# Patient Record
Sex: Male | Born: 1965 | Race: White | Hispanic: No | Marital: Married | Smoking: Current every day smoker
Health system: Southern US, Community
[De-identification: ages and names within clinical notes are randomized; demographics above are authoritative.]

## PROBLEM LIST (undated history)

## (undated) DIAGNOSIS — C3490 Malignant neoplasm of unspecified part of unspecified bronchus or lung: Secondary | ICD-10-CM

## (undated) DIAGNOSIS — K219 Gastro-esophageal reflux disease without esophagitis: Secondary | ICD-10-CM

## (undated) DIAGNOSIS — F329 Major depressive disorder, single episode, unspecified: Secondary | ICD-10-CM

## (undated) DIAGNOSIS — F1011 Alcohol abuse, in remission: Secondary | ICD-10-CM

## (undated) DIAGNOSIS — J9610 Chronic respiratory failure, unspecified whether with hypoxia or hypercapnia: Secondary | ICD-10-CM

## (undated) DIAGNOSIS — J449 Chronic obstructive pulmonary disease, unspecified: Secondary | ICD-10-CM

## (undated) DIAGNOSIS — E119 Type 2 diabetes mellitus without complications: Secondary | ICD-10-CM

## (undated) DIAGNOSIS — I469 Cardiac arrest, cause unspecified: Secondary | ICD-10-CM

## (undated) DIAGNOSIS — F1490 Cocaine use, unspecified, uncomplicated: Secondary | ICD-10-CM

## (undated) DIAGNOSIS — I2510 Atherosclerotic heart disease of native coronary artery without angina pectoris: Secondary | ICD-10-CM

## (undated) DIAGNOSIS — F419 Anxiety disorder, unspecified: Secondary | ICD-10-CM

## (undated) DIAGNOSIS — I779 Disorder of arteries and arterioles, unspecified: Secondary | ICD-10-CM

## (undated) DIAGNOSIS — M797 Fibromyalgia: Secondary | ICD-10-CM

## (undated) DIAGNOSIS — E785 Hyperlipidemia, unspecified: Secondary | ICD-10-CM

## (undated) DIAGNOSIS — R5600 Simple febrile convulsions: Secondary | ICD-10-CM

## (undated) DIAGNOSIS — Z720 Tobacco use: Secondary | ICD-10-CM

## (undated) DIAGNOSIS — F32A Depression, unspecified: Secondary | ICD-10-CM

## (undated) DIAGNOSIS — I10 Essential (primary) hypertension: Secondary | ICD-10-CM

## (undated) HISTORY — PX: RESECTION DISTAL CLAVICAL: SHX5053

## (undated) HISTORY — DX: Alcohol abuse, in remission: F10.11

## (undated) HISTORY — DX: Cardiac arrest, cause unspecified: I46.9

## (undated) HISTORY — DX: Malignant neoplasm of unspecified part of unspecified bronchus or lung: C34.90

## (undated) HISTORY — DX: Major depressive disorder, single episode, unspecified: F32.9

## (undated) HISTORY — DX: Hyperlipidemia, unspecified: E78.5

## (undated) HISTORY — DX: Atherosclerotic heart disease of native coronary artery without angina pectoris: I25.10

## (undated) HISTORY — DX: Disorder of arteries and arterioles, unspecified: I77.9

## (undated) HISTORY — DX: Cocaine use, unspecified, uncomplicated: F14.90

## (undated) HISTORY — PX: CARDIAC CATHETERIZATION: SHX172

## (undated) HISTORY — DX: Anxiety disorder, unspecified: F41.9

## (undated) HISTORY — DX: Tobacco use: Z72.0

## (undated) HISTORY — DX: Depression, unspecified: F32.A

## (undated) HISTORY — DX: Gastro-esophageal reflux disease without esophagitis: K21.9

## (undated) HISTORY — DX: Chronic respiratory failure, unspecified whether with hypoxia or hypercapnia: J96.10

## (undated) HISTORY — PX: HERNIA REPAIR: SHX51

## (undated) HISTORY — DX: Fibromyalgia: M79.7

## (undated) HISTORY — DX: Essential (primary) hypertension: I10

## (undated) HISTORY — DX: Simple febrile convulsions: R56.00

---

## 1990-10-10 HISTORY — PX: LEG SURGERY: SHX1003

## 1990-10-10 HISTORY — PX: HIP SURGERY: SHX245

## 1999-06-19 ENCOUNTER — Emergency Department (HOSPITAL_COMMUNITY): Admission: EM | Admit: 1999-06-19 | Discharge: 1999-06-20 | Payer: Self-pay | Admitting: Internal Medicine

## 2000-12-30 ENCOUNTER — Emergency Department (HOSPITAL_COMMUNITY): Admission: EM | Admit: 2000-12-30 | Discharge: 2000-12-30 | Payer: Self-pay | Admitting: Emergency Medicine

## 2000-12-30 ENCOUNTER — Encounter: Payer: Self-pay | Admitting: Emergency Medicine

## 2001-05-27 ENCOUNTER — Emergency Department (HOSPITAL_COMMUNITY): Admission: EM | Admit: 2001-05-27 | Discharge: 2001-05-27 | Payer: Self-pay | Admitting: Emergency Medicine

## 2001-12-02 ENCOUNTER — Emergency Department (HOSPITAL_COMMUNITY): Admission: EM | Admit: 2001-12-02 | Discharge: 2001-12-02 | Payer: Self-pay | Admitting: Emergency Medicine

## 2002-03-23 ENCOUNTER — Encounter: Payer: Self-pay | Admitting: Emergency Medicine

## 2002-03-23 ENCOUNTER — Emergency Department (HOSPITAL_COMMUNITY): Admission: EM | Admit: 2002-03-23 | Discharge: 2002-03-23 | Payer: Self-pay | Admitting: Emergency Medicine

## 2002-05-16 ENCOUNTER — Encounter: Payer: Self-pay | Admitting: Emergency Medicine

## 2002-05-16 ENCOUNTER — Emergency Department (HOSPITAL_COMMUNITY): Admission: EM | Admit: 2002-05-16 | Discharge: 2002-05-16 | Payer: Self-pay | Admitting: Emergency Medicine

## 2004-01-07 ENCOUNTER — Emergency Department (HOSPITAL_COMMUNITY): Admission: EM | Admit: 2004-01-07 | Discharge: 2004-01-07 | Payer: Self-pay | Admitting: Emergency Medicine

## 2004-05-12 ENCOUNTER — Encounter: Admission: RE | Admit: 2004-05-12 | Discharge: 2004-05-12 | Payer: Self-pay | Admitting: Internal Medicine

## 2005-10-13 ENCOUNTER — Ambulatory Visit (HOSPITAL_COMMUNITY): Admission: RE | Admit: 2005-10-13 | Discharge: 2005-10-13 | Payer: Self-pay | Admitting: Cardiology

## 2005-10-13 ENCOUNTER — Ambulatory Visit: Payer: Self-pay | Admitting: Cardiology

## 2006-05-19 ENCOUNTER — Emergency Department (HOSPITAL_COMMUNITY): Admission: EM | Admit: 2006-05-19 | Discharge: 2006-05-19 | Payer: Self-pay | Admitting: Emergency Medicine

## 2009-02-21 ENCOUNTER — Emergency Department (HOSPITAL_COMMUNITY): Admission: EM | Admit: 2009-02-21 | Discharge: 2009-02-21 | Payer: Self-pay | Admitting: *Deleted

## 2010-03-20 ENCOUNTER — Emergency Department (HOSPITAL_COMMUNITY): Admission: EM | Admit: 2010-03-20 | Discharge: 2010-03-21 | Payer: Self-pay | Admitting: Emergency Medicine

## 2010-04-24 ENCOUNTER — Emergency Department (HOSPITAL_COMMUNITY): Admission: EM | Admit: 2010-04-24 | Discharge: 2010-04-24 | Payer: Self-pay | Admitting: Emergency Medicine
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
## 2010-12-27 LAB — CBC
HCT: 38.3 % — ABNORMAL LOW (ref 39.0–52.0)
Hemoglobin: 12.6 g/dL — ABNORMAL LOW (ref 13.0–17.0)
MCHC: 33 g/dL (ref 30.0–36.0)
MCV: 92.3 fL (ref 78.0–100.0)
Platelets: 297 K/uL (ref 150–400)
RBC: 4.15 MIL/uL — ABNORMAL LOW (ref 4.22–5.81)
RDW: 15 % (ref 11.5–15.5)
WBC: 8.6 K/uL (ref 4.0–10.5)

## 2010-12-27 LAB — DIFFERENTIAL
Basophils Absolute: 0 10*3/uL (ref 0.0–0.1)
Basophils Relative: 0 % (ref 0–1)
Eosinophils Absolute: 0.2 10*3/uL (ref 0.0–0.7)
Eosinophils Relative: 3 % (ref 0–5)
Lymphocytes Relative: 38 % (ref 12–46)
Lymphs Abs: 3.2 K/uL (ref 0.7–4.0)
Monocytes Absolute: 0.7 10*3/uL (ref 0.1–1.0)
Monocytes Relative: 8 % (ref 3–12)
Neutro Abs: 4.4 10*3/uL (ref 1.7–7.7)
Neutrophils Relative %: 52 % (ref 43–77)

## 2010-12-27 LAB — BASIC METABOLIC PANEL
CO2: 29 mEq/L (ref 19–32)
Chloride: 105 mEq/L (ref 96–112)
Creatinine, Ser: 0.75 mg/dL (ref 0.4–1.5)
GFR calc Af Amer: >60 mL/min (ref >60)
Potassium: 3.2 mEq/L — ABNORMAL LOW (ref 3.5–5.1)

## 2010-12-27 LAB — ETHANOL: Alcohol, Ethyl (B): 288 mg/dL — ABNORMAL HIGH (ref 0–10)

## 2010-12-27 LAB — RAPID URINE DRUG SCREEN, HOSP PERFORMED
Amphetamines: NOT DETECTED
Barbiturates: NOT DETECTED
Benzodiazepines: POSITIVE — AB
Cocaine: NOT DETECTED
Opiates: NOT DETECTED
Tetrahydrocannabinol: POSITIVE — AB

## 2010-12-27 LAB — BASIC METABOLIC PANEL WITH GFR
BUN: 2 mg/dL — ABNORMAL LOW (ref 6–23)
Calcium: 8.4 mg/dL (ref 8.4–10.5)
GFR calc non Af Amer: >60 mL/min (ref >60)
Glucose, Bld: 140 mg/dL — ABNORMAL HIGH (ref 70–99)
Sodium: 142 meq/L (ref 135–145)

## 2011-01-18 LAB — ETHANOL: Alcohol, Ethyl (B): 123 mg/dL — ABNORMAL HIGH (ref 0–10)

## 2011-02-25 NOTE — Cardiovascular Report (Signed)
NAME:  Wesley Howell, Wesley Howell NO.:  [id number]   MEDICAL RECORD NO.:  [id number]          PATIENT TYPE:  OIB   LOCATION:  2899                         FACILITY:  [facility]   PHYSICIAN:  Salvadore Farber, M.D. LHCDATE OF BIRTH:  [birth year]   DATE OF PROCEDURE:  10/13/2005  DATE OF DISCHARGE:                              CARDIAC CATHETERIZATION   PROCEDURE:  Left heart catheterization, left ventriculography, coronary  angiography.   INDICATIONS:  [name] is a 45 year old gentleman with longstanding  tobacco abuse and dyslipidemia who presents with atypical chest discomfort  occurring both at rest and at exertion and lasting 1-2 minutes. This has  been present for a long period of time, but has been somewhat worse of late.  It prompted admission to the hospital and transfer for cardiac  catheterization.   PROCEDURAL TECHNIQUE:  Informed consent was obtained. Under 1% lidocaine  local anesthesia, a 5-French sheath was placed in the right common femoral  artery using the modified Seldinger technique. Diagnostic angiography and  ventriculography were performed using JL-4, JR-4, and pigtail catheters. The  patient tolerated the procedure well and was transferred to holding room in  stable condition.   COMPLICATIONS:  None.   FINDINGS:  1.  LV:  135/10/17. EF 65% without regional wall motion abnormality.  2.  No aortic stenosis or mitral regurgitation.  3.  Left main:  Angiographically normal.  4.  LAD:  Moderate size vessel giving rise to a single large diagonal.  It      is angiographically normal.  5.  Circumflex:  Moderate-sized vessel giving rise to 3 obtuse marginals.      The first marginal has a 20% ostial stenosis. The remainder is free of      significant disease.  6.  RCA:  Moderate-sized dominant vessel.  There is a 20% stenosis of the      mid vessel.   IMPRESSION/PLAN:  The patient has minimal coronary disease. There is no  cardiac explanation for his  chest discomfort. Will discharge him to home for  further evaluation by Dr. Sylvie Farrier.      Salvadore Farber, M.D. [facility]  Electronically Signed     WED/MEDQ  D:  10/13/2005  T:  10/13/2005  Job:  [id number]   cc:   Heide Guile, MD  Fax: [phone]
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
## 2011-03-07 ENCOUNTER — Emergency Department (HOSPITAL_COMMUNITY)
Admission: EM | Admit: 2011-03-07 | Discharge: 2011-03-07 | Disposition: A | Discharge status: Home / Self Care | Payer: Medicaid Other | Attending: Emergency Medicine | Admitting: Emergency Medicine

## 2011-03-07 DIAGNOSIS — E789 Disorder of lipoprotein metabolism, unspecified: Secondary | ICD-10-CM | POA: Insufficient documentation

## 2011-03-07 DIAGNOSIS — I2510 Atherosclerotic heart disease of native coronary artery without angina pectoris: Secondary | ICD-10-CM | POA: Insufficient documentation

## 2011-03-07 DIAGNOSIS — I10 Essential (primary) hypertension: Secondary | ICD-10-CM | POA: Insufficient documentation

## 2011-03-07 DIAGNOSIS — M7989 Other specified soft tissue disorders: Secondary | ICD-10-CM

## 2011-03-07 DIAGNOSIS — E119 Type 2 diabetes mellitus without complications: Secondary | ICD-10-CM | POA: Insufficient documentation

## 2011-03-07 DIAGNOSIS — Z79899 Other long term (current) drug therapy: Secondary | ICD-10-CM | POA: Insufficient documentation

## 2011-03-07 DIAGNOSIS — J438 Other emphysema: Secondary | ICD-10-CM | POA: Insufficient documentation

## 2011-03-07 DIAGNOSIS — N23 Unspecified renal colic: Secondary | ICD-10-CM | POA: Insufficient documentation

## 2011-03-07 DIAGNOSIS — R609 Edema, unspecified: Secondary | ICD-10-CM | POA: Insufficient documentation

## 2011-03-07 DIAGNOSIS — M79609 Pain in unspecified limb: Secondary | ICD-10-CM | POA: Insufficient documentation

## 2011-03-07 DIAGNOSIS — F329 Major depressive disorder, single episode, unspecified: Secondary | ICD-10-CM | POA: Insufficient documentation

## 2011-03-07 DIAGNOSIS — F3289 Other specified depressive episodes: Secondary | ICD-10-CM | POA: Insufficient documentation

## 2011-03-07 LAB — BASIC METABOLIC PANEL: GFR calc Af Amer: >60 mL/min (ref >60)

## 2011-03-07 LAB — D-DIMER, QUANTITATIVE: D-Dimer, Quant: 1.03 ug/mL-FEU — ABNORMAL HIGH (ref 0.00–0.48)

## 2012-10-17 ENCOUNTER — Encounter (HOSPITAL_COMMUNITY): Payer: Self-pay | Admitting: Emergency Medicine

## 2012-10-17 ENCOUNTER — Emergency Department (HOSPITAL_COMMUNITY): Payer: No Typology Code available for payment source

## 2012-10-17 ENCOUNTER — Emergency Department (HOSPITAL_COMMUNITY)
Admission: EM | Admit: 2012-10-17 | Discharge: 2012-10-17 | Disposition: A | Discharge status: Home / Self Care | Payer: No Typology Code available for payment source | Attending: Emergency Medicine | Admitting: Emergency Medicine

## 2012-10-17 DIAGNOSIS — J4489 Other specified chronic obstructive pulmonary disease: Secondary | ICD-10-CM | POA: Insufficient documentation

## 2012-10-17 DIAGNOSIS — IMO0002 Reserved for concepts with insufficient information to code with codable children: Secondary | ICD-10-CM | POA: Insufficient documentation

## 2012-10-17 DIAGNOSIS — Y9389 Activity, other specified: Secondary | ICD-10-CM | POA: Insufficient documentation

## 2012-10-17 DIAGNOSIS — Z79899 Other long term (current) drug therapy: Secondary | ICD-10-CM | POA: Insufficient documentation

## 2012-10-17 DIAGNOSIS — J449 Chronic obstructive pulmonary disease, unspecified: Secondary | ICD-10-CM | POA: Insufficient documentation

## 2012-10-17 DIAGNOSIS — S20219A Contusion of unspecified front wall of thorax, initial encounter: Secondary | ICD-10-CM | POA: Insufficient documentation

## 2012-10-17 DIAGNOSIS — S46912A Strain of unspecified muscle, fascia and tendon at shoulder and upper arm level, left arm, initial encounter: Secondary | ICD-10-CM

## 2012-10-17 DIAGNOSIS — F17200 Nicotine dependence, unspecified, uncomplicated: Secondary | ICD-10-CM | POA: Insufficient documentation

## 2012-10-17 DIAGNOSIS — E119 Type 2 diabetes mellitus without complications: Secondary | ICD-10-CM | POA: Insufficient documentation

## 2012-10-17 HISTORY — DX: Chronic obstructive pulmonary disease, unspecified: J44.9

## 2012-10-17 HISTORY — DX: Type 2 diabetes mellitus without complications: E11.9

## 2012-10-17 MED ORDER — ALPRAZOLAM 0.25 MG PO TABS
1.0000 mg | ORAL_TABLET | Freq: Once | ORAL | Status: AC
  Administered 2012-10-17: 1 mg via ORAL
  Filled 2012-10-17: qty 3
  Filled 2012-10-17: qty 1

## 2012-10-17 MED ORDER — OXYCODONE HCL 5 MG PO TABS
30.0000 mg | ORAL_TABLET | Freq: Once | ORAL | Status: AC
  Administered 2012-10-17: 30 mg via ORAL
  Filled 2012-10-17: qty 6
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
## 2012-10-17 NOTE — ED Notes (Signed)
Backboard removed with assistance from Dr. March Rummage, no back tenderness noted.

## 2012-10-17 NOTE — ED Notes (Signed)
Patient transported to X-ray 

## 2012-10-18 NOTE — ED Provider Notes (Signed)
I have personally seen and examined the patient.  I have discussed the plan of care with the resident.  I have reviewed the documentation on PMH/FH/Soc. History.  I have reviewed the documentation of the resident and agree.   Joya Gaskins, MD 10/18/12 [id number]

## 2013-01-02 ENCOUNTER — Other Ambulatory Visit: Payer: Self-pay | Admitting: Neurology

## 2013-01-02 DIAGNOSIS — S82899B Other fracture of unspecified lower leg, initial encounter for open fracture type I or II: Secondary | ICD-10-CM

## 2013-01-02 DIAGNOSIS — H532 Diplopia: Secondary | ICD-10-CM

## 2013-01-02 DIAGNOSIS — S0230XA Fracture of orbital floor, unspecified side, initial encounter for closed fracture: Secondary | ICD-10-CM

## 2013-01-02 DIAGNOSIS — R51 Headache: Secondary | ICD-10-CM

## 2013-01-04 ENCOUNTER — Inpatient Hospital Stay: Admission: RE | Admit: 2013-01-04 | Payer: Medicaid Other | Source: Ambulatory Visit

## 2013-03-05 ENCOUNTER — Institutional Professional Consult (permissible substitution): Payer: Medicaid Other | Admitting: Cardiology

## 2013-05-02 ENCOUNTER — Institutional Professional Consult (permissible substitution): Payer: Medicaid Other | Admitting: Cardiology

## 2013-07-09 ENCOUNTER — Institutional Professional Consult (permissible substitution): Payer: Medicaid Other | Admitting: Cardiology

## 2013-07-16 ENCOUNTER — Encounter: Payer: Self-pay | Admitting: *Deleted

## 2013-07-19 ENCOUNTER — Encounter: Payer: Self-pay | Admitting: Cardiology

## 2013-07-19 ENCOUNTER — Ambulatory Visit (INDEPENDENT_AMBULATORY_CARE_PROVIDER_SITE_OTHER): Payer: Medicaid Other | Admitting: Cardiology

## 2013-07-19 VITALS — BP 149/80 | Ht 65.0 in | Wt 144.0 lb

## 2013-07-19 DIAGNOSIS — R0609 Other forms of dyspnea: Secondary | ICD-10-CM

## 2013-07-19 DIAGNOSIS — R0989 Other specified symptoms and signs involving the circulatory and respiratory systems: Secondary | ICD-10-CM

## 2013-07-19 LAB — COMPREHENSIVE METABOLIC PANEL
ALT: 31 U/L (ref 0–53)
AST: 22 U/L (ref 0–37)
Albumin: 3.7 g/dL (ref 3.5–5.2)
Alkaline Phosphatase: 108 U/L (ref 39–117)
BUN: 10 mg/dL (ref 6–23)
CO2: 29 mEq/L (ref 19–32)
Calcium: 9 mg/dL (ref 8.4–10.5)
Chloride: 103 mEq/L (ref 96–112)
Creatinine, Ser: 1 mg/dL (ref 0.4–1.5)
GFR: 89.46 mL/min (ref >60.00)
Glucose, Bld: 95 mg/dL (ref 70–99)
Potassium: 3.7 mEq/L (ref 3.5–5.1)
Sodium: 139 mEq/L (ref 135–145)
Total Bilirubin: 0.5 mg/dL (ref 0.3–1.2)
Total Protein: 7.4 g/dL (ref 6.0–8.3)

## 2013-07-19 LAB — LIPID PANEL
Cholesterol: 172 mg/dL (ref 0–200)
HDL: 52.2 mg/dL (ref >39.00)
LDL Cholesterol: 106 mg/dL — ABNORMAL HIGH (ref 0–99)
Total CHOL/HDL Ratio: 3
Triglycerides: 68 mg/dL (ref 0.0–149.0)
VLDL: 13.6 mg/dL (ref 0.0–40.0)

## 2013-07-19 LAB — CBC WITH DIFFERENTIAL/PLATELET
Basophils Absolute: 0 10*3/uL (ref 0.0–0.1)
Basophils Relative: 0.3 % (ref 0.0–3.0)
Eosinophils Absolute: 0.1 10*3/uL (ref 0.0–0.7)
Eosinophils Relative: 1.5 % (ref 0.0–5.0)
HCT: 41 % (ref 39.0–52.0)
Hemoglobin: 13.7 g/dL (ref 13.0–17.0)
Lymphocytes Relative: 37 % (ref 12.0–46.0)
Lymphs Abs: 2.9 10*3/uL (ref 0.7–4.0)
MCHC: 33.3 g/dL (ref 30.0–36.0)
MCV: 86 fl (ref 78.0–100.0)
Monocytes Absolute: 0.5 10*3/uL (ref 0.1–1.0)
Monocytes Relative: 6.4 % (ref 3.0–12.0)
Neutro Abs: 4.3 10*3/uL (ref 1.4–7.7)
Neutrophils Relative %: 54.8 % (ref 43.0–77.0)
Platelets: 212 10*3/uL (ref 150.0–400.0)
RBC: 4.76 Mil/uL (ref 4.22–5.81)
RDW: 14.8 % — ABNORMAL HIGH (ref 11.5–14.6)
WBC: 7.8 10*3/uL (ref 4.5–10.5)

## 2013-07-19 MED ORDER — AMLODIPINE BESYLATE 2.5 MG PO TABS: 2.5000 mg | ORAL_TABLET | Freq: Every day | ORAL | Status: DC

## 2013-07-19 MED ORDER — PREDNISONE 20 MG PO TABS: ORAL_TABLET | ORAL | Status: DC

## 2013-07-19 NOTE — Patient Instructions (Signed)
Start Amlodipine 2.5 mg daily for blood pressure   Start Prednisone 20 mg take 2 tablets ( 40 mg ) daily for 5 days  Your physician has requested that you have an echocardiogram. Echocardiography is a painless test that uses sound waves to create images of your heart. It provides your doctor with information about the size and shape of your heart and how well your heart's chambers and valves are working. This procedure takes approximately one hour. There are no restrictions for this procedure.  Schedule Stress Myoview  Follow instructions given   Your physician recommends that you schedule a follow-up appointment in 1 month after test
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
## 2013-07-23 ENCOUNTER — Telehealth: Payer: Self-pay | Admitting: Cardiology

## 2013-07-23 NOTE — Telephone Encounter (Signed)
Pt is advised that his lab work was normal, he verbalized understanding.

## 2013-07-23 NOTE — Telephone Encounter (Signed)
New Problem  Pt calling for the results of his blood test// please call

## 2013-08-01 ENCOUNTER — Institutional Professional Consult (permissible substitution): Payer: Medicaid Other | Admitting: Cardiology

## 2013-08-07 ENCOUNTER — Ambulatory Visit (HOSPITAL_COMMUNITY): Payer: Medicaid Other | Attending: Cardiology | Admitting: Radiology

## 2013-08-07 ENCOUNTER — Ambulatory Visit (HOSPITAL_BASED_OUTPATIENT_CLINIC_OR_DEPARTMENT_OTHER): Payer: Medicaid Other

## 2013-08-07 VITALS — BP 125/72 | Ht 65.0 in | Wt 141.0 lb

## 2013-08-07 DIAGNOSIS — I10 Essential (primary) hypertension: Secondary | ICD-10-CM | POA: Insufficient documentation

## 2013-08-07 DIAGNOSIS — E785 Hyperlipidemia, unspecified: Secondary | ICD-10-CM | POA: Insufficient documentation

## 2013-08-07 DIAGNOSIS — Z8249 Family history of ischemic heart disease and other diseases of the circulatory system: Secondary | ICD-10-CM | POA: Insufficient documentation

## 2013-08-07 DIAGNOSIS — I2510 Atherosclerotic heart disease of native coronary artery without angina pectoris: Secondary | ICD-10-CM | POA: Insufficient documentation

## 2013-08-07 DIAGNOSIS — J449 Chronic obstructive pulmonary disease, unspecified: Secondary | ICD-10-CM | POA: Insufficient documentation

## 2013-08-07 DIAGNOSIS — E119 Type 2 diabetes mellitus without complications: Secondary | ICD-10-CM | POA: Insufficient documentation

## 2013-08-07 DIAGNOSIS — R0602 Shortness of breath: Secondary | ICD-10-CM

## 2013-08-07 DIAGNOSIS — R002 Palpitations: Secondary | ICD-10-CM | POA: Insufficient documentation

## 2013-08-07 DIAGNOSIS — I379 Nonrheumatic pulmonary valve disorder, unspecified: Secondary | ICD-10-CM | POA: Insufficient documentation

## 2013-08-07 DIAGNOSIS — J4489 Other specified chronic obstructive pulmonary disease: Secondary | ICD-10-CM | POA: Insufficient documentation

## 2013-08-07 DIAGNOSIS — I080 Rheumatic disorders of both mitral and aortic valves: Secondary | ICD-10-CM | POA: Insufficient documentation

## 2013-08-07 DIAGNOSIS — I079 Rheumatic tricuspid valve disease, unspecified: Secondary | ICD-10-CM | POA: Insufficient documentation

## 2013-08-07 DIAGNOSIS — R079 Chest pain, unspecified: Secondary | ICD-10-CM | POA: Insufficient documentation

## 2013-08-07 DIAGNOSIS — R42 Dizziness and giddiness: Secondary | ICD-10-CM | POA: Insufficient documentation

## 2013-08-07 DIAGNOSIS — R0989 Other specified symptoms and signs involving the circulatory and respiratory systems: Secondary | ICD-10-CM | POA: Insufficient documentation

## 2013-08-07 DIAGNOSIS — F17200 Nicotine dependence, unspecified, uncomplicated: Secondary | ICD-10-CM | POA: Insufficient documentation

## 2013-08-07 DIAGNOSIS — R0609 Other forms of dyspnea: Secondary | ICD-10-CM | POA: Insufficient documentation

## 2013-08-07 MED ORDER — REGADENOSON 0.4 MG/5ML IV SOLN
0.4000 mg | Freq: Once | INTRAVENOUS | Status: AC
  Administered 2013-08-07: 0.4 mg via INTRAVENOUS

## 2013-08-07 MED ORDER — TECHNETIUM TC 99M SESTAMIBI GENERIC - CARDIOLITE
33.0000 | Freq: Once | INTRAVENOUS | Status: AC | PRN
  Administered 2013-08-07: 33 via INTRAVENOUS

## 2013-08-07 MED ORDER — TECHNETIUM TC 99M SESTAMIBI GENERIC - CARDIOLITE
11.0000 | Freq: Once | INTRAVENOUS | Status: AC | PRN
  Administered 2013-08-07: 11 via INTRAVENOUS

## 2013-08-07 NOTE — Progress Notes (Signed)
Echocardiogram performed.  

## 2013-08-07 NOTE — Progress Notes (Signed)
[facility] SITE 3 NUCLEAR MED [address] [place], [place] [address] [phone]    Cardiology Nuclear Med Study  Wesley Howell is a 47 y.o. male     MRN : [id number]     DOB: 1966-03-30  Procedure Date: 08/07/2013  Nuclear Med Background Indication for Stress Test:  Evaluation for Ischemia History:  CAD EF: 65% 2012, 08/07/13 ECHO, Cardiac Risk Factors: Family History - CAD, Hypertension, Lipids and Smoker  Symptoms:  Chest Pain, Dizziness, Palpitations and SOB   Nuclear Pre-Procedure Caffeine/Decaff Intake:  None > 12 hrs NPO After: 7:00pm   Lungs:  clear O2 Sat: 98% on room air. IV 0.9% NS with Angio Cath:  22g  IV Site: R Antecubital x 1, tolerated well IV Started by:  Bonnita Levan, RN  Chest Size (in):  38 Cup Size: n/a  Height: 5\' 5"  (1.651 m)  Weight:  141 lb (63.957 kg)  BMI:  Body mass index is 23.46 kg/(m^2). Tech Comments:  Ventolin inhaler at 0800 today per patient. Irean Hong, RN    Nuclear Med Study 1 or 2 day study: 1 day  Stress Test Type:  Eugenie Birks  Reading MD: Cassell Clement, MD  Order Authorizing Provider:  Tobias Alexander, MD  Resting Radionuclide: Technetium 18m Sestamibi  Resting Radionuclide Dose: 11.0 mCi   Stress Radionuclide:  Technetium 63m Sestamibi  Stress Radionuclide Dose: 33.0 mCi           Stress Protocol Rest HR: 71 Stress HR: 87  Rest BP: 125/72 Stress BP: 115/49  Exercise Time (min): n/a METS: n/a   Predicted Max HR: 171 bpm % Max HR: 50.88 bpm Rate Pressure Product: 21308   Dose of Adenosine (mg):  n/a Dose of Lexiscan: 0.4 mg  Dose of Atropine (mg): n/a Dose of Dobutamine: n/a mcg/kg/min (at max HR)  Stress Test Technologist: Milana Na, EMT-P  Nuclear Technologist:  Domenic Polite, CNMT     Rest Procedure:  Myocardial perfusion imaging was performed at rest 45 minutes following the intravenous administration of Technetium 72m Sestamibi. Rest ECG: NSR - Normal EKG  Stress Procedure:  The patient  received IV Lexiscan 0.4 mg over 15-seconds.  Technetium 62m Sestamibi injected at 30-seconds. This patient had sob, pressure in his head, and fatigue with the Lexiscan injection.  Quantitative spect images were obtained after a 45 minute delay. Stress ECG: No significant change from baseline ECG  QPS Raw Data Images:  Normal; no motion artifact; normal heart/lung ratio. Stress Images:  Normal homogeneous uptake in all areas of the myocardium. Rest Images:  Normal homogeneous uptake in all areas of the myocardium. Subtraction (SDS):  No evidence of ischemia. Transient Ischemic Dilatation (Normal <1.22):  1.06 Lung/Heart Ratio (Normal <0.45):  0.38  Quantitative Gated Spect Images QGS EDV:  108 ml QGS ESV:  43 ml  Impression Exercise Capacity:  Lexiscan with no exercise. BP Response:  Normal blood pressure response. Clinical Symptoms:  No chest pain. ECG Impression:  No significant ST segment change suggestive of ischemia. Comparison with Prior Nuclear Study: No images to compare  Overall Impression:  Normal stress nuclear study.  LV Ejection Fraction: 60%.  LV Wall Motion:  NL LV Function; NL Wall Motion  [employer]

## 2013-08-08 ENCOUNTER — Telehealth: Payer: Self-pay | Admitting: Cardiology

## 2013-08-08 NOTE — Telephone Encounter (Signed)
The encounter created by accident

## 2013-08-09 ENCOUNTER — Telehealth: Payer: Self-pay

## 2013-08-09 NOTE — Telephone Encounter (Signed)
New problem    Status of test results.

## 2013-08-09 NOTE — Telephone Encounter (Signed)
Pt aware of both echo & stress test Mylo Red RN

## 2013-08-29 ENCOUNTER — Ambulatory Visit (INDEPENDENT_AMBULATORY_CARE_PROVIDER_SITE_OTHER): Payer: Medicaid Other | Admitting: Cardiology

## 2013-08-29 ENCOUNTER — Encounter: Payer: Self-pay | Admitting: Cardiology

## 2013-08-29 ENCOUNTER — Encounter (INDEPENDENT_AMBULATORY_CARE_PROVIDER_SITE_OTHER): Payer: Self-pay

## 2013-08-29 VITALS — BP 118/64 | HR 68 | Ht 65.0 in | Wt 144.0 lb

## 2013-08-29 DIAGNOSIS — R0600 Dyspnea, unspecified: Secondary | ICD-10-CM

## 2013-08-29 DIAGNOSIS — J449 Chronic obstructive pulmonary disease, unspecified: Secondary | ICD-10-CM

## 2013-08-29 DIAGNOSIS — R0609 Other forms of dyspnea: Secondary | ICD-10-CM

## 2013-08-29 DIAGNOSIS — I10 Essential (primary) hypertension: Secondary | ICD-10-CM

## 2013-08-29 DIAGNOSIS — R0989 Other specified symptoms and signs involving the circulatory and respiratory systems: Secondary | ICD-10-CM

## 2013-08-29 DIAGNOSIS — E785 Hyperlipidemia, unspecified: Secondary | ICD-10-CM
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
## 2013-08-29 NOTE — Patient Instructions (Signed)
Your physician wants you to follow-up in: 1 YEAR WITH DR. NELSON You will receive a reminder letter in the mail two months in advance. If you don't receive a letter, please call our office to schedule the follow-up appointment.  Your physician recommends that you continue on your current medications as directed. Please refer to the Current Medication list given to you today.  

## 2014-01-20 ENCOUNTER — Emergency Department (HOSPITAL_COMMUNITY)
Admission: EM | Admit: 2014-01-20 | Discharge: 2014-01-20 | Disposition: A | Discharge status: Home / Self Care | Payer: Medicaid Other | Attending: Emergency Medicine | Admitting: Emergency Medicine

## 2014-01-20 ENCOUNTER — Emergency Department (HOSPITAL_COMMUNITY): Payer: Medicaid Other

## 2014-01-20 ENCOUNTER — Encounter (HOSPITAL_COMMUNITY): Payer: Self-pay | Admitting: Emergency Medicine

## 2014-01-20 DIAGNOSIS — M25559 Pain in unspecified hip: Secondary | ICD-10-CM

## 2014-01-20 DIAGNOSIS — W19XXXA Unspecified fall, initial encounter: Secondary | ICD-10-CM

## 2014-01-20 DIAGNOSIS — F3289 Other specified depressive episodes: Secondary | ICD-10-CM | POA: Insufficient documentation

## 2014-01-20 DIAGNOSIS — R0781 Pleurodynia: Secondary | ICD-10-CM

## 2014-01-20 DIAGNOSIS — W010XXA Fall on same level from slipping, tripping and stumbling without subsequent striking against object, initial encounter: Secondary | ICD-10-CM | POA: Insufficient documentation

## 2014-01-20 DIAGNOSIS — Y93E1 Activity, personal bathing and showering: Secondary | ICD-10-CM | POA: Insufficient documentation

## 2014-01-20 DIAGNOSIS — M549 Dorsalgia, unspecified: Secondary | ICD-10-CM

## 2014-01-20 DIAGNOSIS — S298XXA Other specified injuries of thorax, initial encounter: Secondary | ICD-10-CM | POA: Insufficient documentation

## 2014-01-20 DIAGNOSIS — E119 Type 2 diabetes mellitus without complications: Secondary | ICD-10-CM | POA: Insufficient documentation

## 2014-01-20 DIAGNOSIS — K219 Gastro-esophageal reflux disease without esophagitis: Secondary | ICD-10-CM | POA: Insufficient documentation

## 2014-01-20 DIAGNOSIS — Z7982 Long term (current) use of aspirin: Secondary | ICD-10-CM | POA: Insufficient documentation

## 2014-01-20 DIAGNOSIS — S79929A Unspecified injury of unspecified thigh, initial encounter: Secondary | ICD-10-CM

## 2014-01-20 DIAGNOSIS — IMO0002 Reserved for concepts with insufficient information to code with codable children: Secondary | ICD-10-CM | POA: Insufficient documentation

## 2014-01-20 DIAGNOSIS — J449 Chronic obstructive pulmonary disease, unspecified: Secondary | ICD-10-CM | POA: Insufficient documentation

## 2014-01-20 DIAGNOSIS — E785 Hyperlipidemia, unspecified: Secondary | ICD-10-CM | POA: Insufficient documentation

## 2014-01-20 DIAGNOSIS — S79919A Unspecified injury of unspecified hip, initial encounter: Secondary | ICD-10-CM | POA: Insufficient documentation

## 2014-01-20 DIAGNOSIS — Y92009 Unspecified place in unspecified non-institutional (private) residence as the place of occurrence of the external cause: Secondary | ICD-10-CM | POA: Insufficient documentation

## 2014-01-20 DIAGNOSIS — J4489 Other specified chronic obstructive pulmonary disease: Secondary | ICD-10-CM | POA: Insufficient documentation

## 2014-01-20 DIAGNOSIS — F329 Major depressive disorder, single episode, unspecified: Secondary | ICD-10-CM | POA: Insufficient documentation

## 2014-01-20 DIAGNOSIS — F411 Generalized anxiety disorder: Secondary | ICD-10-CM | POA: Insufficient documentation

## 2014-01-20 DIAGNOSIS — Z9889 Other specified postprocedural states: Secondary | ICD-10-CM | POA: Insufficient documentation

## 2014-01-20 DIAGNOSIS — I10 Essential (primary) hypertension: Secondary | ICD-10-CM | POA: Insufficient documentation

## 2014-01-20 DIAGNOSIS — F17200 Nicotine dependence, unspecified, uncomplicated: Secondary | ICD-10-CM | POA: Insufficient documentation

## 2014-01-20 DIAGNOSIS — Z79899 Other long term (current) drug therapy: Secondary | ICD-10-CM | POA: Insufficient documentation

## 2014-01-20 MED ORDER — OXYCODONE-ACETAMINOPHEN 5-325 MG PO TABS: 1.0000 | ORAL_TABLET | ORAL | Status: DC | PRN

## 2014-01-20 MED ORDER — OXYCODONE-ACETAMINOPHEN 5-325 MG PO TABS
2.0000 | ORAL_TABLET | Freq: Once | ORAL | Status: AC
  Administered 2014-01-20: 2 via ORAL
  Filled 2014-01-20: qty 2
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
## 2014-01-20 NOTE — Discharge Instructions (Signed)
Take the prescribed medication as directed. Follow-up with your primary care physician. Return to the ED for new concerns.

## 2014-01-20 NOTE — ED Notes (Addendum)
Pt reports he fell last Wednesday while in the shower. Fell backwards and land on tub with left hip and left lower back. Pain 10/10. Ambulatory with pain. Pt reports pain increased last night. Reports pain with breathing as well.

## 2014-01-20 NOTE — ED Provider Notes (Signed)
Medical screening examination/treatment/procedure(s) were performed by non-physician practitioner and as supervising physician I was immediately available for consultation/collaboration.   EKG Interpretation None      Rolland Porter, MD, Abram Sander   Janice Norrie, MD 01/20/14 [phone]

## 2014-02-21 ENCOUNTER — Encounter (HOSPITAL_COMMUNITY): Payer: Self-pay | Admitting: Emergency Medicine

## 2014-02-21 ENCOUNTER — Emergency Department (HOSPITAL_COMMUNITY)
Admission: EM | Admit: 2014-02-21 | Discharge: 2014-02-22 | Disposition: A | Discharge status: Home / Self Care | Payer: Medicaid Other | Attending: Emergency Medicine | Admitting: Emergency Medicine

## 2014-02-21 ENCOUNTER — Emergency Department (HOSPITAL_COMMUNITY): Payer: Medicaid Other

## 2014-02-21 DIAGNOSIS — E785 Hyperlipidemia, unspecified: Secondary | ICD-10-CM | POA: Insufficient documentation

## 2014-02-21 DIAGNOSIS — S42033A Displaced fracture of lateral end of unspecified clavicle, initial encounter for closed fracture: Secondary | ICD-10-CM | POA: Insufficient documentation

## 2014-02-21 DIAGNOSIS — F1010 Alcohol abuse, uncomplicated: Secondary | ICD-10-CM | POA: Insufficient documentation

## 2014-02-21 DIAGNOSIS — F411 Generalized anxiety disorder: Secondary | ICD-10-CM | POA: Insufficient documentation

## 2014-02-21 DIAGNOSIS — S199XXA Unspecified injury of neck, initial encounter: Secondary | ICD-10-CM

## 2014-02-21 DIAGNOSIS — F17200 Nicotine dependence, unspecified, uncomplicated: Secondary | ICD-10-CM | POA: Insufficient documentation

## 2014-02-21 DIAGNOSIS — S42001A Fracture of unspecified part of right clavicle, initial encounter for closed fracture: Secondary | ICD-10-CM

## 2014-02-21 DIAGNOSIS — Z7982 Long term (current) use of aspirin: Secondary | ICD-10-CM | POA: Insufficient documentation

## 2014-02-21 DIAGNOSIS — E119 Type 2 diabetes mellitus without complications: Secondary | ICD-10-CM | POA: Insufficient documentation

## 2014-02-21 DIAGNOSIS — J4489 Other specified chronic obstructive pulmonary disease: Secondary | ICD-10-CM | POA: Insufficient documentation

## 2014-02-21 DIAGNOSIS — F329 Major depressive disorder, single episode, unspecified: Secondary | ICD-10-CM | POA: Insufficient documentation

## 2014-02-21 DIAGNOSIS — IMO0002 Reserved for concepts with insufficient information to code with codable children: Secondary | ICD-10-CM | POA: Insufficient documentation

## 2014-02-21 DIAGNOSIS — J449 Chronic obstructive pulmonary disease, unspecified: Secondary | ICD-10-CM | POA: Insufficient documentation

## 2014-02-21 DIAGNOSIS — K219 Gastro-esophageal reflux disease without esophagitis: Secondary | ICD-10-CM | POA: Insufficient documentation

## 2014-02-21 DIAGNOSIS — I10 Essential (primary) hypertension: Secondary | ICD-10-CM | POA: Insufficient documentation

## 2014-02-21 DIAGNOSIS — F3289 Other specified depressive episodes: Secondary | ICD-10-CM | POA: Insufficient documentation

## 2014-02-21 DIAGNOSIS — Z79899 Other long term (current) drug therapy: Secondary | ICD-10-CM | POA: Insufficient documentation

## 2014-02-21 DIAGNOSIS — S0993XA Unspecified injury of face, initial encounter: Secondary | ICD-10-CM | POA: Insufficient documentation

## 2014-02-21 MED ORDER — MORPHINE SULFATE 4 MG/ML IJ SOLN
4.0000 mg | Freq: Once | INTRAMUSCULAR | Status: AC
  Administered 2014-02-21: 4 mg via INTRAVENOUS
  Filled 2014-02-21: qty 1
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
## 2014-02-21 NOTE — ED Notes (Signed)
Bed: WA04 Expected date:  Expected time:  Means of arrival:  Comments: EMS dislocated shoulder
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
## 2014-02-22 ENCOUNTER — Telehealth (HOSPITAL_BASED_OUTPATIENT_CLINIC_OR_DEPARTMENT_OTHER): Payer: Self-pay

## 2014-02-22 ENCOUNTER — Emergency Department (HOSPITAL_COMMUNITY): Payer: Medicaid Other

## 2014-02-22 MED ORDER — OXYCODONE HCL 30 MG PO TABS: 30.0000 mg | ORAL_TABLET | Freq: Four times a day (QID) | ORAL | Status: DC | PRN

## 2014-02-22 NOTE — ED Provider Notes (Signed)
Medical screening examination/treatment/procedure(s) were performed by non-physician practitioner and as supervising physician I was immediately available for consultation/collaboration.   EKG Interpretation None       Teressa Lower, MD 02/22/14 [phone]

## 2014-02-22 NOTE — Discharge Instructions (Signed)
Clavicle Fracture A clavicle fracture is a break in the collarbone. This is a common injury, especially in children. Collarbones do not harden until around the age of 38. Most collarbone fractures are treated with a simple arm sling. In some cases a figure-of-eight splint is used to help hold the broken bones in position. Although not often needed, surgery may be required if the bone fragments are not in the correct position (displaced).  HOME CARE INSTRUCTIONS   Apply ice to the injury for 15-20 minutes each hour while awake for 2 days. Put the ice in a plastic bag and place a towel between the bag of ice and your skin.  Wear the sling or splint constantly for as long as directed by your caregiver. You may remove the sling or splint for bathing or showering. Be sure to keep your shoulder in the same place as when the sling or splint is on. Do not lift your arm.  If a figure-of-eight splint is applied, it must be tightened by another person every day. Tighten it enough to keep the shoulders held back. Allow enough room to place the index finger between the body and strap. Loosen the splint immediately if you feel numbness or tingling in your hands.  Only take over-the-counter or prescription medicines for pain, discomfort, or fever as directed by your caregiver.  Avoid activities that irritate or increase the pain for 4 to 6 weeks after surgery.  Follow all instructions for follow-up with your caregiver. This includes any referrals, physical therapy, and rehabilitation. Any delay in obtaining necessary care could result in a delay or failure of the injury to heal properly. SEEK MEDICAL CARE IF:  You have pain and swelling that are not relieved with medications. SEEK IMMEDIATE MEDICAL CARE IF:  Your arm is numb, cold, or pale, even when the splint is loose. MAKE SURE YOU:   Understand these instructions.  Will watch your condition.  Will get help right away if you are not doing well or get  worse. Document Released: 07/06/2005 Document Revised: 12/19/2011 Document Reviewed: 05/01/2008 [facility] Patient Information 2014 [place].

## 2014-02-22 NOTE — Telephone Encounter (Signed)
Pharmacy calling pt filled Rx for Roxycodone 30 mg #120(30 day supply) and morphine sulfate # 120 on April 27.  Dr Sharol Given advised of pharmacy info and chart review by her.  Do not fill have pt follow up w/person prescribing pain meds if he needs more (before 30 day supply runs out)

## 2014-02-22 NOTE — ED Notes (Signed)
Patient returned from xray.

## 2014-09-09 ENCOUNTER — Ambulatory Visit: Payer: Medicaid Other | Admitting: Cardiology

## 2014-10-13 ENCOUNTER — Ambulatory Visit: Payer: Medicaid Other | Admitting: Cardiology

## 2014-10-24 ENCOUNTER — Ambulatory Visit: Payer: Medicaid Other | Admitting: Cardiology

## 2014-11-18 ENCOUNTER — Institutional Professional Consult (permissible substitution): Payer: Self-pay | Admitting: Neurology

## 2014-11-28 ENCOUNTER — Ambulatory Visit: Payer: Medicaid Other | Admitting: Cardiology

## 2014-12-30 ENCOUNTER — Ambulatory Visit (INDEPENDENT_AMBULATORY_CARE_PROVIDER_SITE_OTHER): Payer: Medicaid Other | Admitting: Neurology

## 2014-12-30 ENCOUNTER — Encounter: Payer: Self-pay | Admitting: Neurology

## 2014-12-30 VITALS — BP 144/78 | HR 69 | Temp 98.1°F | Resp 16 | Ht 64.0 in | Wt 142.0 lb

## 2014-12-30 DIAGNOSIS — H532 Diplopia: Secondary | ICD-10-CM

## 2014-12-30 DIAGNOSIS — S028XXS Fractures of other specified skull and facial bones, sequela: Secondary | ICD-10-CM

## 2014-12-30 DIAGNOSIS — IMO0001 Reserved for inherently not codable concepts without codable children: Secondary | ICD-10-CM

## 2014-12-30 DIAGNOSIS — H269 Unspecified cataract: Secondary | ICD-10-CM | POA: Diagnosis not present

## 2014-12-30 DIAGNOSIS — Z720 Tobacco use: Secondary | ICD-10-CM

## 2014-12-30 DIAGNOSIS — S0280XS Fracture of other specified skull and facial bones, unspecified side, sequela: Secondary | ICD-10-CM

## 2014-12-30 DIAGNOSIS — F17200 Nicotine dependence, unspecified, uncomplicated: Secondary | ICD-10-CM
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
## 2014-12-30 NOTE — Patient Instructions (Addendum)
If your eye problem changes, ask to see an eye physician, ophthalmologist. Your primary doctor may be able to suggest someone locally. Your neurological exam is unchanged, which is reassuring. I will see you back as needed.   Please drink more water, reduce your caffeine intake and stop smoking!

## 2015-02-23 DIAGNOSIS — E119 Type 2 diabetes mellitus without complications: Secondary | ICD-10-CM | POA: Insufficient documentation

## 2015-02-23 DIAGNOSIS — Z79899 Other long term (current) drug therapy: Secondary | ICD-10-CM | POA: Diagnosis not present

## 2015-02-23 DIAGNOSIS — Z7982 Long term (current) use of aspirin: Secondary | ICD-10-CM | POA: Diagnosis not present

## 2015-02-23 DIAGNOSIS — K219 Gastro-esophageal reflux disease without esophagitis: Secondary | ICD-10-CM | POA: Diagnosis not present

## 2015-02-23 DIAGNOSIS — Z7951 Long term (current) use of inhaled steroids: Secondary | ICD-10-CM | POA: Diagnosis not present

## 2015-02-23 DIAGNOSIS — F1110 Opioid abuse, uncomplicated: Secondary | ICD-10-CM | POA: Insufficient documentation

## 2015-02-23 DIAGNOSIS — E785 Hyperlipidemia, unspecified: Secondary | ICD-10-CM | POA: Insufficient documentation

## 2015-02-23 DIAGNOSIS — Z720 Tobacco use: Secondary | ICD-10-CM | POA: Insufficient documentation

## 2015-02-23 DIAGNOSIS — G8929 Other chronic pain: Secondary | ICD-10-CM | POA: Diagnosis not present

## 2015-02-23 DIAGNOSIS — F1010 Alcohol abuse, uncomplicated: Secondary | ICD-10-CM | POA: Insufficient documentation

## 2015-02-23 DIAGNOSIS — M797 Fibromyalgia: Secondary | ICD-10-CM | POA: Diagnosis not present

## 2015-02-23 DIAGNOSIS — Z9889 Other specified postprocedural states: Secondary | ICD-10-CM | POA: Diagnosis not present

## 2015-02-23 DIAGNOSIS — I10 Essential (primary) hypertension: Secondary | ICD-10-CM | POA: Diagnosis not present

## 2015-02-23 DIAGNOSIS — J449 Chronic obstructive pulmonary disease, unspecified: Secondary | ICD-10-CM | POA: Insufficient documentation

## 2015-02-23 NOTE — ED Notes (Addendum)
Pt presents from home with c/o detox from oxycodone and morphine. Pt reports he does not abuse the medication but he would like to get help getting off of them completley. Pt also requesting detox from ETOH, reports he does not drink daily but would like to stop drinking.

## 2015-02-24 ENCOUNTER — Emergency Department (HOSPITAL_COMMUNITY)
Admission: EM | Admit: 2015-02-24 | Discharge: 2015-02-24 | Disposition: A | Discharge status: Home / Self Care | Payer: Medicaid Other | Attending: Emergency Medicine | Admitting: Emergency Medicine

## 2015-02-24 ENCOUNTER — Encounter (HOSPITAL_COMMUNITY): Payer: Self-pay

## 2015-02-24 DIAGNOSIS — F1910 Other psychoactive substance abuse, uncomplicated: Secondary | ICD-10-CM

## 2015-02-24 NOTE — Discharge Instructions (Signed)
Follow up using outpatient resources.
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
## 2015-08-31 ENCOUNTER — Emergency Department (HOSPITAL_COMMUNITY)
Admission: EM | Admit: 2015-08-31 | Discharge: 2015-09-01 | Disposition: A | Discharge status: Home / Self Care | Payer: Medicaid Other | Attending: Emergency Medicine | Admitting: Emergency Medicine

## 2015-08-31 ENCOUNTER — Encounter (HOSPITAL_COMMUNITY): Payer: Self-pay | Admitting: *Deleted

## 2015-08-31 DIAGNOSIS — I10 Essential (primary) hypertension: Secondary | ICD-10-CM | POA: Insufficient documentation

## 2015-08-31 DIAGNOSIS — Z8739 Personal history of other diseases of the musculoskeletal system and connective tissue: Secondary | ICD-10-CM | POA: Insufficient documentation

## 2015-08-31 DIAGNOSIS — Z792 Long term (current) use of antibiotics: Secondary | ICD-10-CM | POA: Diagnosis not present

## 2015-08-31 DIAGNOSIS — J449 Chronic obstructive pulmonary disease, unspecified: Secondary | ICD-10-CM | POA: Diagnosis not present

## 2015-08-31 DIAGNOSIS — E119 Type 2 diabetes mellitus without complications: Secondary | ICD-10-CM | POA: Insufficient documentation

## 2015-08-31 DIAGNOSIS — F17200 Nicotine dependence, unspecified, uncomplicated: Secondary | ICD-10-CM | POA: Diagnosis not present

## 2015-08-31 DIAGNOSIS — F1010 Alcohol abuse, uncomplicated: Secondary | ICD-10-CM | POA: Diagnosis not present

## 2015-08-31 DIAGNOSIS — F1310 Sedative, hypnotic or anxiolytic abuse, uncomplicated: Secondary | ICD-10-CM | POA: Insufficient documentation

## 2015-08-31 LAB — COMPREHENSIVE METABOLIC PANEL
ALT: 35 U/L (ref 17–63)
ANION GAP: 8 (ref 5–15)
AST: 47 U/L — ABNORMAL HIGH (ref 15–41)
Albumin: 3.3 g/dL — ABNORMAL LOW (ref 3.5–5.0)
Alkaline Phosphatase: 140 U/L — ABNORMAL HIGH (ref 38–126)
BILIRUBIN TOTAL: 0.2 mg/dL — AB (ref 0.3–1.2)
BUN: <5 mg/dL — ABNORMAL LOW (ref 6–20)
CO2: 29 mmol/L (ref 22–32)
Calcium: 8.7 mg/dL — ABNORMAL LOW (ref 8.9–10.3)
Chloride: 107 mmol/L (ref 101–111)
Creatinine, Ser: 0.88 mg/dL (ref 0.61–1.24)
Glucose, Bld: 112 mg/dL — ABNORMAL HIGH (ref 65–99)
POTASSIUM: 3.6 mmol/L (ref 3.5–5.1)
Sodium: 144 mmol/L (ref 135–145)
TOTAL PROTEIN: 7.2 g/dL (ref 6.5–8.1)

## 2015-08-31 LAB — RAPID URINE DRUG SCREEN, HOSP PERFORMED
AMPHETAMINES: NOT DETECTED
Barbiturates: NOT DETECTED
Benzodiazepines: POSITIVE — AB
Cocaine: NOT DETECTED
Opiates: NOT DETECTED
TETRAHYDROCANNABINOL: NOT DETECTED

## 2015-08-31 LAB — CBC
HCT: 45.8 % (ref 39.0–52.0)
HEMOGLOBIN: 15.3 g/dL (ref 13.0–17.0)
MCH: 32.3 pg (ref 26.0–34.0)
MCHC: 33.4 g/dL (ref 30.0–36.0)
MCV: 96.8 fL (ref 78.0–100.0)
Platelets: 266 10*3/uL (ref 150–400)
RBC: 4.73 MIL/uL (ref 4.22–5.81)
RDW: 13.7 % (ref 11.5–15.5)
WBC: 5.5 10*3/uL (ref 4.0–10.5)

## 2015-08-31 LAB — ETHANOL: Alcohol, Ethyl (B): 231 mg/dL — ABNORMAL HIGH (ref <5)

## 2015-08-31 NOTE — ED Notes (Signed)
Patient tearful and crying. This RN scored patient with 17 on CIWA scale, MD notified.

## 2015-08-31 NOTE — ED Notes (Signed)
Pt stated that he is "very very severely depressed"
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
## 2015-08-31 NOTE — ED Notes (Signed)
Pt comes to ED requesting alcohol detox. States that he has drank 1/2 gallon vodka. States that is his normal. Denies SI/HI.

## 2015-09-01 ENCOUNTER — Encounter (HOSPITAL_COMMUNITY): Payer: Self-pay | Admitting: Emergency Medicine

## 2015-09-01 MED ORDER — AMLODIPINE BESYLATE 2.5 MG PO TABS: 2.5000 mg | ORAL_TABLET | Freq: Every day | ORAL | Status: DC

## 2015-09-01 NOTE — ED Notes (Signed)
Patient drinking soda and states he has already eaten a [employer] sandwich while out in the waiting room. Patient tolerated both well.

## 2015-09-01 NOTE — Discharge Instructions (Signed)
Alcohol Use Disorder °Alcohol use disorder is a mental disorder. It is not a one-time incident of heavy drinking. Alcohol use disorder is the excessive and uncontrollable use of alcohol over time that leads to problems with functioning in one or more areas of daily living. People with this disorder risk harming themselves and others when they drink to excess. Alcohol use disorder also can cause other mental disorders, such as mood and anxiety disorders, and serious physical problems. People with alcohol use disorder often misuse other drugs.  °Alcohol use disorder is common and widespread. Some people with this disorder drink alcohol to cope with or escape from negative life events. Others drink to relieve chronic pain or symptoms of mental illness. People with a family history of alcohol use disorder are at higher risk of losing control and using alcohol to excess.  °Drinking too much alcohol can cause injury, accidents, and health problems. One drink can be too much when you are: °· Working. °· Pregnant or breastfeeding. °· Taking medicines. Ask your doctor. °· Driving or planning to drive. °SYMPTOMS  °Signs and symptoms of alcohol use disorder may include the following:  °· Consumption of alcohol in larger amounts or over a longer period of time than intended. °· Multiple unsuccessful attempts to cut down or control alcohol use.   °· A great deal of time spent obtaining alcohol, using alcohol, or recovering from the effects of alcohol (hangover). °· A strong desire or urge to use alcohol (cravings).   °· Continued use of alcohol despite problems at work, school, or home because of alcohol use.   °· Continued use of alcohol despite problems in relationships because of alcohol use. °· Continued use of alcohol in situations when it is physically hazardous, such as driving a car. °· Continued use of alcohol despite awareness of a physical or psychological problem that is likely related to alcohol use. Physical  problems related to alcohol use can involve the brain, heart, liver, stomach, and intestines. Psychological problems related to alcohol use include intoxication, depression, anxiety, psychosis, delirium, and dementia.   °· The need for increased amounts of alcohol to achieve the same desired effect, or a decreased effect from the consumption of the same amount of alcohol (tolerance). °· Withdrawal symptoms upon reducing or stopping alcohol use, or alcohol use to reduce or avoid withdrawal symptoms. Withdrawal symptoms include: °¨ Racing heart. °¨ Hand tremor. °¨ Difficulty sleeping. °¨ Nausea. °¨ Vomiting. °¨ Hallucinations. °¨ Restlessness. °¨ Seizures. °DIAGNOSIS °Alcohol use disorder is diagnosed through an assessment by your health care provider. Your health care provider may start by asking three or four questions to screen for excessive or problematic alcohol use. To confirm a diagnosis of alcohol use disorder, at least two symptoms must be present within a 12-month period. The severity of alcohol use disorder depends on the number of symptoms: °· Mild--two or three. °· Moderate--four or five. °· Severe--six or more. °Your health care provider may perform a physical exam or use results from lab tests to see if you have physical problems resulting from alcohol use. Your health care provider may refer you to a mental health professional for evaluation. °TREATMENT  °Some people with alcohol use disorder are able to reduce their alcohol use to low-risk levels. Some people with alcohol use disorder need to quit drinking alcohol. When necessary, mental health professionals with specialized training in substance use treatment can help. Your health care provider can help you decide how severe your alcohol use disorder is and what type of treatment you need.   The following forms of treatment are available:   Detoxification. Detoxification involves the use of prescription medicines to prevent alcohol withdrawal  symptoms in the first week after quitting. This is important for people with a history of symptoms of withdrawal and for heavy drinkers who are likely to have withdrawal symptoms. Alcohol withdrawal can be dangerous and, in severe cases, cause death. Detoxification is usually provided in a hospital or in-patient substance use treatment facility.  Counseling or talk therapy. Talk therapy is provided by substance use treatment counselors. It addresses the reasons people use alcohol and ways to keep them from drinking again. The goals of talk therapy are to help people with alcohol use disorder find healthy activities and ways to cope with life stress, to identify and avoid triggers for alcohol use, and to handle cravings, which can cause relapse.  Medicines.Different medicines can help treat alcohol use disorder through the following actions:  Decrease alcohol cravings.  Decrease the positive reward response felt from alcohol use.  Produce an uncomfortable physical reaction when alcohol is used (aversion therapy).  Support groups. Support groups are run by people who have quit drinking. They provide emotional support, advice, and guidance. These forms of treatment are often combined. Some people with alcohol use disorder benefit from intensive combination treatment provided by specialized substance use treatment centers. Both inpatient and outpatient treatment programs are available.   This information is not intended to replace advice given to you by your health care provider. Make sure you discuss any questions you have with your health care provider.   Document Released: 11/03/2004 Document Revised: 10/17/2014 Document Reviewed: 01/03/2013 Elsevier Interactive Patient Education 2016 [employer].  Emergency Department Resource Guide 1) Find a Doctor and Pay Out of Pocket Although you won't have to find out who is covered by your insurance plan, it is a good idea to ask around and get  recommendations. You will then need to call the office and see if the doctor you have chosen will accept you as a new patient and what types of options they offer for patients who are self-pay. Some doctors offer discounts or will set up payment plans for their patients who do not have insurance, but you will need to ask so you aren't surprised when you get to your appointment.  2) Contact Your Local Health Department Not all health departments have doctors that can see patients for sick visits, but many do, so it is worth a call to see if yours does. If you don't know where your local health department is, you can check in your phone book. The CDC also has a tool to help you locate your state's health department, and many state websites also have listings of all of their local health departments.  3) Find a [facility] If your illness is not likely to be very severe or complicated, you may want to try a walk in clinic. These are popping up all over the country in pharmacies, drugstores, and shopping centers. They're usually staffed by nurse practitioners or physician assistants that have been trained to treat common illnesses and complaints. They're usually fairly quick and inexpensive. However, if you have serious medical issues or chronic medical problems, these are probably not your best option.  No Primary Care Doctor: - Call Health Connect at  [phone] - they can help you locate a primary care doctor that  accepts your insurance, provides certain services, etc. - Physician Referral Service- [phone]  Chronic Pain Problems: Organization  Address  Phone   Notes  [facility]  [phone] Patients need to be referred by their primary care doctor.   Medication Assistance: Organization         Address  Phone   Notes  [facility] Medication [facility] [address] [place], [place] [address] [phone] --Must be a resident of  [facility] -- Must have NO insurance coverage whatsoever (no Medicaid/ Medicare, etc.) -- The pt. MUST have a primary care doctor that directs their care regularly and follows them in the community   [facility]  [phone]   [employer]  [phone]    Agencies that provide inexpensive medical care: Organization         Address  Phone   Notes  [place]  [phone]   [facility]    [phone]   [facility] [place], [place] [address] [phone]   [place] [address], [place] [phone]   Planned Parenthood    [phone]   [facility]    [phone]   [place] and [place] [address], [place] Phone:  [phone], Fax:  [phone] Hours of Operation:  9 am - 6 pm, M-F.  Also accepts Medicaid/Medicare and self-pay.  [facility] for [place] [address], [address], [place] Phone: [phone], Fax: [phone]. Hours of Operation:  8:30 am - 5:30 pm, M-F.  Also accepts Medicaid and self-pay.  [facility] [place] [address], [place] Phone: [phone]   [place], [place], [place] [phone], Ext. [phone] Mondays & Thursdays: 7-9 AM.  First 15 patients are seen on a first come, first serve basis.    [place] Providers:  Organization         Address  Phone   Notes  [facility] [address],  [phone] Also accepts self-pay patients.  [facility] [address], [place]  [phone]   [place], [address], [place] [phone]   [facility] [address], [place] [phone]   Lucianne Lei [address], [place]   [phone] Only accepts [place] Access  [place] patients after they have their name applied to their card.   Self-Pay (no insurance) in [facility]:  Organization         Address  Phone   Notes  Sickle Cell Patients, [facility] Internal Medicine [place] [phone]   [facility] Urgent Care [place] [phone]   [facility] [place]  [place], [place], [place] [phone]   [facility]/Dr. Osei-Bonsu  [address] [place] or [address], [place] [phone] Phone number for both [place] and [place] locations is the same.  [facility] [address], [place] [phone]   [facility] [address], [place] or [address] [phone]   [facility] [address] [place] [phone], phone; [phone], fax Sees patients 1st and 3rd Saturday of every month.  Must not qualify  for public or private insurance (i.e. Medicaid, Medicare, [place] Health Choice, Veterans' Benefits)  Household income should be no more than 200% of the poverty level The clinic cannot treat you if you are pregnant or think you are pregnant  Sexually transmitted diseases are not treated at the clinic.    Dental Care: Organization         Address  Phone  Notes  [facility] [place] [phone] Accepts children up to age 66 who are enrolled in [place] or [place]; pregnant women with a Medicaid card; and children who have applied for Medicaid or [place] Health Choice, but were declined, whose parents can pay a reduced fee at time of service.  [facility]  [address], [place] [phone] Accepts children up to age 46 who are enrolled in [place] or [place]; pregnant women with a Medicaid  card; and children who have applied for Medicaid or [place] Health Choice, but were declined, whose parents can pay a reduced fee at time of service.  [place] Adult Dental Access PROGRAM  [place] [phone] Patients are seen by appointment only. Walk-ins are not accepted. [place] will see patients 28 years of age and older. Monday - Tuesday (8am-5pm) Most Wednesdays (8:30-5pm) $30 per visit, cash only  [facility] Adult [facility] Access PRO[facility]  [address], [facility] [phone] Patients are seen by appointment only. Walk-ins are not accepted. [place] will see patients 14 years of age and older. One Wednesday Evening (Monthly: Volunteer Based).  $30 per visit, cash only  [place]  [phone] for adults; Children under age 40, call Graduate Pediatric Dentistry at [phone]. Children aged 47-14, please call [phone] to request a pediatric application.  Dental services are provided in all areas of dental care including fillings, crowns and bridges, complete and partial dentures, implants, gum treatment, root canals, and extractions. Preventive care is also provided. Treatment is provided to both adults and children. Patients are selected via a lottery and there is often a waiting list.   [facility] [address], [place]  [phone] [email or web address]   [facility] [address] [place], [place] [phone], Ext. [phone] Second and Fourth Thursday of each month, opens at 6:30 AM; Clinic ends at 9 AM.  Patients are seen on a first-come first-served basis, and a limited number are seen during each clinic.   [facility]  [address] Hillard Danker [place], [place] [phone]   Eligibility Requirements You must have lived in [place], [place], or [place] counties for at least the last three months.   You cannot be eligible for state or federal sponsored [employer],  including [employer], [place], or [employer].   You generally cannot be eligible for healthcare insurance through your employer.    How to apply: Eligibility screenings are held every Tuesday and Wednesday afternoon from 1:00 pm until 4:00 pm. You do not need an appointment for the interview!  [facility] [address], [place], [place]   [place]  [place] Department  Advance  [phone]    Behavioral Health Resources in the Community: Intensive Outpatient Programs Organization         Address  Phone  Notes  [facility] [address] [place], [place] [phone]   [facility] Outpatient [address], [place], [place]   ADS: Alcohol & Drug Svcs [address], [place], [place]   [place] [address]  [place], [place] or [phone]   Substance Abuse Resources Organization         Address  Phone  Notes  Alcohol and Drug Services  [phone]   [place]  [phone]   The [place]   Chinita Pester  [phone]   Residential & Outpatient Substance Abuse Program  [phone]   Psychological Services Organization         Address  Phone  Notes  [facility] [place]  [place]  [phone]   [place] [address], [place] or [phone]    Mobile Crisis Teams Organization         Address  Phone  Notes  [facility], Mobile Crisis Care Unit  [phone]   [facility]  [address] [place], [place]   [place] Levels [address] [place] [phone]    Self-Help/Support Groups Organization         Address  Phone             Notes  [place].  of [place] - variety of support groups  [place] Call for more information  Narcotics Anonymous (NA), [facility] [address], [employer]   2 meetings at this location   Special educational needs teacher         Address  Phone  Notes  [facility]idential [facility] [place],    [place]  [phone]   [facility]  [address] [place] [address]80, [place], [place]   [place], [place] [phone] Admissions: 8am-3pm M-F  Incentives Substance [place] [address],    [place], [place] [phone]   [facility] [address] [place], [place], [place]   [facility] [address],  [place], [place]   [facility] - Intensive Outpatient [address] Kristeen [address], [place], [place]   [facility] ([place].,  [place], [place] [phone] or [phone]   [facility] ([facility]) [address], [place], [place] Accepts Medicaid  [facility] [place] [address]  [place] [phone] Substance Abuse/Addiction Treatment   [facility] Organization         Address  Phone  Notes  [facility]  [phone]   Domenic Schwab, PhD [address] Arlis Porta [place], [place]   [phone] or [phone]   [place]   [address] [place], [place] [phone]   [facility] [address], [place], [place] [phone] Insurance/Medicaid/sponsorship through [facility] and Families [address], [place]                                    [place], [place] [phone] [place] [address], [place] [phone]    Dr. Adele Schilder  [phone]   [facility] [place] [place]  [facility]. 1) [address] [place] 2)  [place] 3)  [place] [address], [place] [phone]  [phone]   [place] [phone] or [phone] (After Hours)

## 2015-09-01 NOTE — ED Notes (Signed)
Patient verbalized understanding of discharge instructions and denies further questions at this time. VS stable.

## 2015-10-27 ENCOUNTER — Emergency Department (HOSPITAL_COMMUNITY)
Admission: EM | Admit: 2015-10-27 | Discharge: 2015-10-27 | Disposition: A | Discharge status: Home / Self Care | Payer: Medicaid Other | Attending: Emergency Medicine | Admitting: Emergency Medicine

## 2015-10-27 ENCOUNTER — Encounter (HOSPITAL_COMMUNITY): Payer: Self-pay | Admitting: Emergency Medicine

## 2015-10-27 DIAGNOSIS — Z79899 Other long term (current) drug therapy: Secondary | ICD-10-CM | POA: Diagnosis not present

## 2015-10-27 DIAGNOSIS — Z8719 Personal history of other diseases of the digestive system: Secondary | ICD-10-CM | POA: Diagnosis not present

## 2015-10-27 DIAGNOSIS — F17200 Nicotine dependence, unspecified, uncomplicated: Secondary | ICD-10-CM | POA: Insufficient documentation

## 2015-10-27 DIAGNOSIS — J449 Chronic obstructive pulmonary disease, unspecified: Secondary | ICD-10-CM | POA: Diagnosis not present

## 2015-10-27 DIAGNOSIS — F419 Anxiety disorder, unspecified: Secondary | ICD-10-CM | POA: Insufficient documentation

## 2015-10-27 DIAGNOSIS — Z8739 Personal history of other diseases of the musculoskeletal system and connective tissue: Secondary | ICD-10-CM | POA: Diagnosis not present

## 2015-10-27 DIAGNOSIS — F1010 Alcohol abuse, uncomplicated: Secondary | ICD-10-CM | POA: Diagnosis present

## 2015-10-27 DIAGNOSIS — E119 Type 2 diabetes mellitus without complications: Secondary | ICD-10-CM | POA: Diagnosis not present

## 2015-10-27 DIAGNOSIS — I10 Essential (primary) hypertension: Secondary | ICD-10-CM | POA: Insufficient documentation

## 2015-10-27 MED ORDER — CHLORDIAZEPOXIDE HCL 25 MG PO CAPS: ORAL_CAPSULE | ORAL | Status: DC

## 2015-10-27 NOTE — ED Notes (Signed)
Pt ambulated independently with no difficulty.
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
## 2015-10-27 NOTE — ED Notes (Signed)
Patient walked to the bathroom and smoked a cigarette. Patient made aware that smoking is not allowed here and that he may be discharged if it occurs again.

## 2015-10-27 NOTE — ED Notes (Addendum)
Pt brought in by EMS for detox from alcohol and drugs. EMS states at first he says he took 20 xanax, then states he took 10, then said he took 3, patient appears very intoxicated in triage. EMS states he's complaining of his chronic neck and back pain.   Pt states he took "3 or 4" xanax before coming here, denies SI/HI, gait unsteady d/t intoxication, vitals stable in triage.

## 2015-10-27 NOTE — ED Notes (Signed)
Bed: [facility] Expected date:  Expected time:  Means of arrival:  Comments: [place]

## 2015-10-27 NOTE — Discharge Instructions (Signed)
°Emergency Department Resource Guide °1) Find a Doctor and Pay Out of Pocket °Although you won't have to find out who is covered by your insurance plan, it is a good idea to ask around and get recommendations. You will then need to call the office and see if the doctor you have chosen will accept you as a new patient and what types of options they offer for patients who are self-pay. Some doctors offer discounts or will set up payment plans for their patients who do not have insurance, but you will need to ask so you aren't surprised when you get to your appointment. ° °2) Contact Your Local Health Department °Not all health departments have doctors that can see patients for sick visits, but many do, so it is worth a call to see if yours does. If you don't know where your local health department is, you can check in your phone book. The CDC also has a tool to help you locate your state's health department, and many state websites also have listings of all of their local health departments. ° °3) Find a Walk-in Clinic °If your illness is not likely to be very severe or complicated, you may want to try a walk in clinic. These are popping up all over the country in pharmacies, drugstores, and shopping centers. They're usually staffed by nurse practitioners or physician assistants that have been trained to treat common illnesses and complaints. They're usually fairly quick and inexpensive. However, if you have serious medical issues or chronic medical problems, these are probably not your best option. ° °No Primary Care Doctor: °- Call Health Connect at  832-8000 - they can help you locate a primary care doctor that  accepts your insurance, provides certain services, etc. °- Physician Referral Service- 1-800-533-3463 ° °Chronic Pain Problems: °Organization         Address  Phone   Notes  ° [facility]  (336) 297-2271 Patients need to be referred by their primary care doctor.  ° °Medication  Assistance: °Organization         Address  Phone   Notes  °[place] [facility] [address] °[place], [place] [address] (336) 641-8030 --Must be a resident of [place] °-- Must have NO insurance coverage whatsoever (no Medicaid/ Medicare, etc.) °-- The pt. MUST have a primary care doctor that directs their care regularly and follows them in the community °  °[facility]  (866) 331-1348   °[employer]  (888) 892-1162   ° °Agencies that provide inexpensive medical care: °Organization         Address  Phone   Notes  °[facility]  (336) 832-8035   °[facility]    (336) 832-7272   °[facility] [address] °[place], [place] [address] (336) 832-4777   °[facility] [address], °[place] (336) 271-4999   °[facility]    (336) 373-0678   °[facility]    (336) 272-1050   °[facility] ° [address], [place] Phone:  (336) 832-4444, Fax:  (336) 832-4440 Hours of Operation:  9 am - 6 pm, M-F.  Also accepts Medicaid/Medicare and self-pay.  °[facility] ° [address], [place] Phone: (336) 832-3150, Fax: (336) 832-3151. Hours of Operation:  8:30 am - 5:30 pm, M-F.  Also accepts Medicaid and self-pay.  °[facility] [place] [address], [place] Phone: (336) 878-6027   °[facility] [address], [place], [place] (336)723-1848, Ext. 123 Mondays & Thursdays: 7-9 AM.  First 15 patients are seen on a first come, first serve basis. °  ° °Medicaid-accepting [place] Providers: ° °Organization         Address  Phone   Notes  °[facility] [address], [place] (336) 641-2100 Also accepts self-pay patients.  °[facility] [address], [place] ° (336) 856-9996   °[facility] [address], [place]  (336) 288-8857   °[facility] [address] [place] [address], [place] (336) 299-7000   °Veita Bland [address], [place]  ° (336) 373-1557 Only accepts [place] Access Medicaid patients after they have their name applied to their card.  ° °Self-Pay (no insurance) in [place]: ° °Organization         Address  Phone   Notes  °Sickle Cell Patients, [facility] [address], [place] (336) 832-1970   °[facility] [address], [place] (336) 832-4400   ° [facility] ° [address], [place] (336) 992-4800   °[facility]/Dr. Osei-Bonsu ° [address] [place] [address], [place] or [address], [place] (336) 841-8500 Phone number for both [place] and [place] locations is the same.  °[facility] [address], [place] (336) 299-0000   °[facility] [address] [place] [address], [place] or [address] (336) 852-7530 °(336) 878-2260   °[facility] [address], [place] (336) 350-1642, phone; (336) 294-5005, fax Sees patients 1st and 3rd Saturday of every month.  Must not qualify for public or private insurance (i.e. Medicaid, Medicare, [place] Health Choice, Veterans' Benefits) • Household income should be no more than 200% of the poverty level •The clinic cannot treat you if you are pregnant or think you are pregnant • Sexually transmitted diseases are not treated at the clinic.  ° ° °Dental Care: °Organization         Address  Phone  Notes  °[place] [facility] [address], [place] (336) 641-6152 Accepts children up to age 21 who are enrolled in Medicaid or [place] Health Choice; pregnant women with a Medicaid card; and children who have applied for Medicaid or [place] Health Choice, but were declined, whose parents can pay a reduced fee at time of service.  °[place]  [facility] [place]  [address], [place] (336) 641-7733 Accepts children up to age 21 who are enrolled in Medicaid or [place] Health Choice; pregnant women with a Medicaid card; and children who have applied for Medicaid or [place] Health Choice, but were declined, whose parents can pay a reduced fee at time of service.  °[facility] ° [address],  (336) 641-4533 Patients are seen by appointment only. Walk-ins are not accepted. [facility] will see patients 18 years of age and older. °Monday - Tuesday (8am-5pm) °Most Wednesdays (8:30-5pm) °$30 per visit, cash only  °[facility] ° [address], [place] (336) 641-4533 Patients are seen by appointment only. Walk-ins are not accepted. [facility] will see patients 18 years of age and older. °One   Wednesday Evening (Monthly: Volunteer Based).  $30 per visit, cash only  °[facility]  (919) 537-3737 for adults; Children under age 4, call Graduate Pediatric Dentistry at (919) [phone]. Children aged 4-14, please call (919) [phone] to request a pediatric application. ° Dental services are provided in all areas of dental care including fillings, crowns and bridges, complete and partial dentures, implants, gum treatment, root canals, and extractions. Preventive care is also provided. Treatment is provided to both adults and children. °Patients are selected via a lottery and there is often a waiting list. °  °[facility] [address], °[place] ° (336) 763-8833 [email or web address] °  °[facility] [address], [place], [place] (336)723-1848, Ext. 123 Second and Fourth Thursday of each month, opens at 6:30 AM; Clinic ends at 9 AM.  Patients are seen on a first-come first-served basis, and a limited number are seen during each clinic.  ° °[facility] ° [address], [place], [place] (336) 723-7904    Eligibility Requirements °You must have lived in [place], [place], or [place] counties for at least the last three months. °  You cannot be eligible for state or federal sponsored healthcare insurance, including Veterans Administration, Medicaid, or Medicare. °  You generally cannot be eligible for healthcare insurance through your employer.  °  How to apply: °Eligibility screenings are held every Tuesday and Wednesday afternoon from 1:00 pm until 4:00 pm. You do not need an appointment for the interview!  °[facility] [address], [place], [place] 336-631-2330   °[facility]  336-342-8273   °[facility]  336-703-3100   °[facility]  336-570-6415   ° °Behavioral Health Resources in the Community: °Intensive Outpatient Programs °Organization         Address  Phone  Notes  °[facility] [address], [place], [place] 336-878-6098   °[facility] [address], [place], [place] 336-832-9800   °[facility] [address], [place], [place] ° 336-882-2125   °[facility] [address],  °[place], [place] 1-800-853-5163 or 336-641-4981   °Substance Abuse Resources °Organization         Address  Phone  Notes  °[facility]  336-882-2125   °[facility]  336-784-9470   °[facility]  336-285-9073   °[facility]  336-845-3988   °Residential & Outpatient Substance Abuse Program  1-800-659-3381   °Psychological Services °Organization         Address  Phone  Notes  °[facility]  336- 832-9600   °[facility]  336- 378-7881   °[facility] [address], [place] 1-800-853-5163 or 336-641-4981   ° °Mobile Crisis Teams °Organization         Address  Phone  Notes  °[facility], Mobile Crisis Care Unit  1-877-626-1772   °Assertive °[facility] ° [address]  [place], [place] 336-834-9664   °Sharon DeEsch [address] °[place] 336-554-5454   ° °Self-Help/Support Groups °Organization         Address  Phone             Notes  °[facility] - variety of support groups  336- 373-1402 Call for more information  °Narcotics Anonymous (NA), [facility] [address], °[place]   2 meetings at this location  ° °  Residential Treatment Programs °Organization         Address  Phone  Notes  °[facility] [address],    °[place]  1-866-801-8205   °[facility] ° [address], [place], [place] 704-293-8524   °[facility] [address], [place] 336-845-3988 Admissions: 8am-3pm M-F  °[facility] [address]    °[place], [place] 336-841-1104   °[facility] [address], [place], [place] 336-379-7146   °[facility] [address]  °[place], [place] 336-285-9073   °[facility] - Intensive Outpatient [address], [place], [place] 336-852-3033   °[facility] [address]  °[place], [place] 1-877-615-2722 or 336-784-9470   °[facility] [address] [place], [place] 336-227-7417 Accepts Medicaid  °[facility] [address]  °[place] 1-800-659-3381 Substance Abuse/Addiction Treatment  ° °[place] Behavioral Health Resources °Organization         Address  Phone  Notes  °[facility]  (888) 581-9988   °Julie Brannon, PhD [address] [place], [place]   (336) 349-5553 or (336) 951-0000   °[facility]   [address] °[place], [place] (336) 349-4454   °[facility] [address], [place], [place] (336) 342-8316 Insurance/Medicaid/sponsorship through [facility]  °[facility] [address]                                    [place], [place] (336) 342-8316 Therapy/tele-psych/case    °[facility] [address]  ° [place],  (336) 349-2233    °Dr. Arfeen  (336) 349-4544   °[facility] [place]  [employer] [place] [facility] 1) [address], [place] °2) [address], [place] °3)  [address], [place] (336) 349-3220 °(336) 342-7768 ° °(336) 342-8140   °[place] [facility] (336) 342-1394 or (336) 342-3537 (After Hours)    ° ° °

## 2016-02-17 ENCOUNTER — Ambulatory Visit: Payer: Medicaid Other | Admitting: Cardiology

## 2016-03-02 ENCOUNTER — Ambulatory Visit: Payer: Medicaid Other | Admitting: Cardiology

## 2016-03-10 ENCOUNTER — Encounter: Payer: Self-pay | Admitting: Cardiology

## 2016-07-05 ENCOUNTER — Ambulatory Visit (INDEPENDENT_AMBULATORY_CARE_PROVIDER_SITE_OTHER): Payer: Medicaid Other | Admitting: Physician Assistant

## 2016-07-05 ENCOUNTER — Encounter: Payer: Self-pay | Admitting: Physician Assistant

## 2016-07-05 VITALS — BP 170/80 | HR 75 | Ht 65.0 in | Wt 161.1 lb

## 2016-07-05 DIAGNOSIS — R079 Chest pain, unspecified: Secondary | ICD-10-CM | POA: Diagnosis not present

## 2016-07-05 DIAGNOSIS — Z23 Encounter for immunization: Secondary | ICD-10-CM

## 2016-07-05 DIAGNOSIS — E785 Hyperlipidemia, unspecified: Secondary | ICD-10-CM

## 2016-07-05 DIAGNOSIS — Z720 Tobacco use: Secondary | ICD-10-CM | POA: Diagnosis not present

## 2016-07-05 DIAGNOSIS — I10 Essential (primary) hypertension: Secondary | ICD-10-CM

## 2016-07-05 DIAGNOSIS — I2510 Atherosclerotic heart disease of native coronary artery without angina pectoris: Secondary | ICD-10-CM

## 2016-07-05 DIAGNOSIS — R002 Palpitations: Secondary | ICD-10-CM | POA: Diagnosis not present

## 2016-07-05 LAB — CBC
HCT: 41.1 % (ref 38.5–50.0)
Hemoglobin: 13.8 g/dL (ref 13.2–17.1)
MCH: 30 pg (ref 27.0–33.0)
MCHC: 33.6 g/dL (ref 32.0–36.0)
MCV: 89.3 fL (ref 80.0–100.0)
MPV: 9.7 fL (ref 7.5–12.5)
PLATELETS: 324 10*3/uL (ref 140–400)
RBC: 4.6 MIL/uL (ref 4.20–5.80)
RDW: 14.9 % (ref 11.0–15.0)
WBC: 9 10*3/uL (ref 3.8–10.8)

## 2016-07-05 LAB — TSH: TSH: 0.21 m[IU]/L — AB (ref 0.40–4.50)

## 2016-07-05 LAB — BASIC METABOLIC PANEL
BUN: 5 mg/dL — ABNORMAL LOW (ref 7–25)
CALCIUM: 9.4 mg/dL (ref 8.6–10.3)
CO2: 23 mmol/L (ref 20–31)
Chloride: 101 mmol/L (ref 98–110)
Creat: 0.93 mg/dL (ref 0.70–1.33)
Glucose, Bld: 120 mg/dL — ABNORMAL HIGH (ref 65–99)
Potassium: 3.8 mmol/L (ref 3.5–5.3)
SODIUM: 137 mmol/L (ref 135–146)

## 2016-07-05 LAB — MAGNESIUM: Magnesium: 1.9 mg/dL (ref 1.5–2.5)

## 2016-07-05 MED ORDER — ASPIRIN EC 81 MG PO TBEC: 81.0000 mg | DELAYED_RELEASE_TABLET | Freq: Every day | ORAL | 3 refills | Status: AC

## 2016-07-05 MED ORDER — AMLODIPINE BESYLATE 5 MG PO TABS: 7.5000 mg | ORAL_TABLET | Freq: Every day | ORAL | Status: DC

## 2016-07-05 NOTE — Patient Instructions (Signed)
Medication Instructions:  1) START Amlodipine '5mg'$ - Take 1.5 tabs a day to equal 7.'5mg'$   Labwork: BMET, CBC, Magnesium and TSH today  Testing/Procedures: Your physician has requested that you have a lexiscan myoview. For further information please visit [email or web address]. Please follow instruction sheet, as given.  Your physician has recommended that you wear an event monitor. Event monitors are medical devices that record the heart's electrical activity. Doctors most often [place] these monitors to diagnose arrhythmias. Arrhythmias are problems with the speed or rhythm of the heartbeat. The monitor is a small, portable device. You can wear one while you do your normal daily activities. This is usually used to diagnose what is causing palpitations/syncope (passing out).    Follow-Up: Your physician recommends that you schedule a follow-up appointment in: 1 month after monitor with Melina Copa, PA-C.[name]   Any Other Special Instructions Will Be Listed Below (If Applicable).  Please call on Friday or Monday with your blood pressure readings.   If you need a refill on your cardiac medications before your next appointment, please call your pharmacy.
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
## 2016-07-11 ENCOUNTER — Telehealth: Payer: Self-pay | Admitting: Physician Assistant

## 2016-07-11 NOTE — Telephone Encounter (Signed)
New message   Pt verbalized that he is to call in bp reading 07/11/16  138/90 hr 84

## 2016-07-13 ENCOUNTER — Telehealth (HOSPITAL_COMMUNITY): Payer: Self-pay | Admitting: *Deleted

## 2016-07-13 MED ORDER — AMLODIPINE BESYLATE 10 MG PO TABS
10.0000 mg | ORAL_TABLET | Freq: Every day | ORAL | 1 refills | Status: DC
Start: 2016-07-13 — End: 2018-01-01

## 2016-07-13 NOTE — Telephone Encounter (Signed)
Please increase amlodipine to '10mg'$  daily. Jariah Jarmon PA-C

## 2016-07-13 NOTE — Telephone Encounter (Signed)
Patient given detailed instructions per Myocardial Perfusion Study Information Sheet for the test on 07/18/16. Patient notified to arrive 15 minutes early and that it is imperative to arrive on time for appointment to keep from having the test rescheduled.  If you need to cancel or reschedule your appointment, please call the office within 24 hours of your appointment. Failure to do so may result in a cancellation of your appointment, and a $50 no show fee. Patient verbalized understanding. Kirstie Peri

## 2016-07-13 NOTE — Telephone Encounter (Signed)
Spoke with pt to let him know that we are increasing the Amlodipine to 10 mg qd from 7.5 mg q d, Pt agreeable with this plan. Rx called into [facility], per pt request.

## 2016-07-18 ENCOUNTER — Ambulatory Visit (INDEPENDENT_AMBULATORY_CARE_PROVIDER_SITE_OTHER): Payer: Medicaid Other

## 2016-07-18 ENCOUNTER — Ambulatory Visit (HOSPITAL_COMMUNITY): Payer: Medicaid Other | Attending: Cardiovascular Disease

## 2016-07-18 DIAGNOSIS — R002 Palpitations: Secondary | ICD-10-CM | POA: Insufficient documentation

## 2016-07-18 DIAGNOSIS — E119 Type 2 diabetes mellitus without complications: Secondary | ICD-10-CM | POA: Diagnosis not present

## 2016-07-18 DIAGNOSIS — I10 Essential (primary) hypertension: Secondary | ICD-10-CM | POA: Diagnosis not present

## 2016-07-18 DIAGNOSIS — I2510 Atherosclerotic heart disease of native coronary artery without angina pectoris: Secondary | ICD-10-CM | POA: Diagnosis not present

## 2016-07-18 DIAGNOSIS — R079 Chest pain, unspecified: Secondary | ICD-10-CM | POA: Diagnosis not present

## 2016-07-18 DIAGNOSIS — Z8249 Family history of ischemic heart disease and other diseases of the circulatory system: Secondary | ICD-10-CM | POA: Insufficient documentation

## 2016-07-18 DIAGNOSIS — R0609 Other forms of dyspnea: Secondary | ICD-10-CM | POA: Diagnosis not present

## 2016-07-18 LAB — MYOCARDIAL PERFUSION IMAGING
CHL CUP NUCLEAR SRS: 0
CHL CUP NUCLEAR SSS: 3
CSEPPHR: 88 {beats}/min
LV dias vol: 131 mL (ref 62–150)
LV sys vol: 51 mL
NUC STRESS TID: 1.02
RATE: 0.44
Rest HR: 72 {beats}/min
SDS: 3

## 2016-07-18 MED ORDER — TECHNETIUM TC 99M TETROFOSMIN IV KIT
10.1000 | PACK | Freq: Once | INTRAVENOUS | Status: AC | PRN
  Administered 2016-07-18: 10.1 via INTRAVENOUS
  Filled 2016-07-18: qty 10

## 2016-07-18 MED ORDER — TECHNETIUM TC 99M TETROFOSMIN IV KIT
31.7000 | PACK | Freq: Once | INTRAVENOUS | Status: AC | PRN
  Administered 2016-07-18: 31.7 via INTRAVENOUS
  Filled 2016-07-18: qty 32

## 2016-07-18 MED ORDER — REGADENOSON 0.4 MG/5ML IV SOLN
0.4000 mg | Freq: Once | INTRAVENOUS | Status: AC
  Administered 2016-07-18: 0.4 mg via INTRAVENOUS

## 2016-07-19 ENCOUNTER — Telehealth: Payer: Self-pay | Admitting: *Deleted

## 2016-07-19 MED ORDER — NICOTINE 14 MG/24HR TD PT24: 14.0000 mg | MEDICATED_PATCH | Freq: Every day | TRANSDERMAL | 0 refills | Status: DC

## 2016-07-19 MED ORDER — NICOTINE 7 MG/24HR TD PT24: 7.0000 mg | MEDICATED_PATCH | Freq: Every day | TRANSDERMAL | 0 refills | Status: DC

## 2016-07-19 MED ORDER — NICOTINE 21 MG/24HR TD PT24: 21.0000 mg | MEDICATED_PATCH | Freq: Every day | TRANSDERMAL | 0 refills | Status: DC

## 2016-07-19 NOTE — Telephone Encounter (Signed)
Called patient and informed nicotine patches have been ordered to his pharmacy, [facility].  Pt is appreciative for the help.

## 2016-07-19 NOTE — Telephone Encounter (Signed)
-----   Message from Charlie Pitter, PA-C sent at 07/19/2016  7:39 AM EDT ----- Please rx the following: Apply '21mg'$  patch one daily for 6 weeks (dispense #42), then change to '14mg'$  patch one daily for 2 weeks (dispense #14), then change to '7mg'$  patch one daily for 2 weeks (dispense #14). Remove old patch before applying new one.

## 2016-09-07 ENCOUNTER — Ambulatory Visit: Payer: Medicaid Other | Admitting: Physician Assistant

## 2016-09-15 ENCOUNTER — Ambulatory Visit: Payer: Medicaid Other | Admitting: Physician Assistant

## 2016-09-15 ENCOUNTER — Encounter: Payer: Self-pay | Admitting: Physician Assistant

## 2016-09-15 DIAGNOSIS — R002 Palpitations: Secondary | ICD-10-CM | POA: Insufficient documentation
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
## 2016-09-16 ENCOUNTER — Encounter: Payer: Self-pay | Admitting: Physician Assistant

## 2016-11-24 ENCOUNTER — Encounter (HOSPITAL_COMMUNITY): Payer: Self-pay | Admitting: Emergency Medicine

## 2016-11-24 ENCOUNTER — Emergency Department (HOSPITAL_COMMUNITY)
Admission: EM | Admit: 2016-11-24 | Discharge: 2016-11-24 | Disposition: A | Discharge status: Home / Self Care | Payer: Medicaid Other | Attending: Emergency Medicine | Admitting: Emergency Medicine

## 2016-11-24 DIAGNOSIS — F10129 Alcohol abuse with intoxication, unspecified: Secondary | ICD-10-CM | POA: Diagnosis present

## 2016-11-24 DIAGNOSIS — Z7982 Long term (current) use of aspirin: Secondary | ICD-10-CM | POA: Insufficient documentation

## 2016-11-24 DIAGNOSIS — E119 Type 2 diabetes mellitus without complications: Secondary | ICD-10-CM | POA: Diagnosis not present

## 2016-11-24 DIAGNOSIS — I2510 Atherosclerotic heart disease of native coronary artery without angina pectoris: Secondary | ICD-10-CM | POA: Diagnosis not present

## 2016-11-24 DIAGNOSIS — K2920 Alcoholic gastritis without bleeding: Secondary | ICD-10-CM | POA: Insufficient documentation

## 2016-11-24 DIAGNOSIS — I10 Essential (primary) hypertension: Secondary | ICD-10-CM | POA: Insufficient documentation

## 2016-11-24 DIAGNOSIS — F1010 Alcohol abuse, uncomplicated: Secondary | ICD-10-CM

## 2016-11-24 DIAGNOSIS — J449 Chronic obstructive pulmonary disease, unspecified: Secondary | ICD-10-CM | POA: Insufficient documentation

## 2016-11-24 DIAGNOSIS — F17200 Nicotine dependence, unspecified, uncomplicated: Secondary | ICD-10-CM | POA: Insufficient documentation

## 2016-11-24 LAB — CBC
HCT: 48.4 % (ref 39.0–52.0)
Hemoglobin: 17.1 g/dL — ABNORMAL HIGH (ref 13.0–17.0)
MCH: 30.4 pg (ref 26.0–34.0)
MCHC: 35.3 g/dL (ref 30.0–36.0)
MCV: 86 fL (ref 78.0–100.0)
Platelets: 403 10*3/uL — ABNORMAL HIGH (ref 150–400)
RBC: 5.63 MIL/uL (ref 4.22–5.81)
RDW: 15.6 % — ABNORMAL HIGH (ref 11.5–15.5)
WBC: 8.8 10*3/uL (ref 4.0–10.5)

## 2016-11-24 LAB — COMPREHENSIVE METABOLIC PANEL
ALT: 43 U/L (ref 17–63)
AST: 49 U/L — ABNORMAL HIGH (ref 15–41)
Albumin: 4.2 g/dL (ref 3.5–5.0)
Alkaline Phosphatase: 148 U/L — ABNORMAL HIGH (ref 38–126)
Anion gap: 16 — ABNORMAL HIGH (ref 5–15)
BUN: 12 mg/dL (ref 6–20)
CHLORIDE: 98 mmol/L — AB (ref 101–111)
CO2: 23 mmol/L (ref 22–32)
CREATININE: 0.97 mg/dL (ref 0.61–1.24)
Calcium: 8.9 mg/dL (ref 8.9–10.3)
Glucose, Bld: 143 mg/dL — ABNORMAL HIGH (ref 65–99)
POTASSIUM: 4.2 mmol/L (ref 3.5–5.1)
Sodium: 137 mmol/L (ref 135–145)
Total Bilirubin: 1.2 mg/dL (ref 0.3–1.2)
Total Protein: 8.4 g/dL — ABNORMAL HIGH (ref 6.5–8.1)

## 2016-11-24 LAB — SALICYLATE LEVEL

## 2016-11-24 LAB — ETHANOL: ALCOHOL ETHYL (B): 315 mg/dL — AB (ref <5)

## 2016-11-24 LAB — ACETAMINOPHEN LEVEL: Acetaminophen (Tylenol), Serum: <10 ug/mL — ABNORMAL LOW (ref 10–30)

## 2016-11-24 MED ORDER — THIAMINE HCL 100 MG/ML IJ SOLN
100.0000 mg | Freq: Once | INTRAMUSCULAR | Status: AC
  Administered 2016-11-24: 100 mg via INTRAVENOUS
  Filled 2016-11-24: qty 2

## 2016-11-24 MED ORDER — KETOROLAC TROMETHAMINE 30 MG/ML IJ SOLN
30.0000 mg | Freq: Once | INTRAMUSCULAR | Status: AC
  Administered 2016-11-24: 30 mg via INTRAVENOUS
  Filled 2016-11-24: qty 1

## 2016-11-24 MED ORDER — ONDANSETRON HCL 4 MG/2ML IJ SOLN
4.0000 mg | Freq: Once | INTRAMUSCULAR | Status: AC
  Administered 2016-11-24: 4 mg via INTRAVENOUS

## 2016-11-24 MED ORDER — LORAZEPAM 2 MG/ML IJ SOLN
1.0000 mg | Freq: Once | INTRAMUSCULAR | Status: AC
  Administered 2016-11-24: 1 mg via INTRAVENOUS
  Filled 2016-11-24: qty 1

## 2016-11-24 MED ORDER — ONDANSETRON HCL 4 MG/2ML IJ SOLN
4.0000 mg | Freq: Once | INTRAMUSCULAR | Status: DC
  Filled 2016-11-24: qty 2

## 2016-11-24 MED ORDER — LORAZEPAM 2 MG/ML IJ SOLN
1.0000 mg | Freq: Once | INTRAMUSCULAR | Status: AC
Start: 2016-11-24 — End: 2016-11-24
  Administered 2016-11-24: 1 mg via INTRAVENOUS
  Filled 2016-11-24: qty 1

## 2016-11-24 MED ORDER — CHLORDIAZEPOXIDE HCL 25 MG PO CAPS
25.0000 mg | ORAL_CAPSULE | Freq: Once | ORAL | Status: AC
  Administered 2016-11-24: 25 mg via ORAL
  Filled 2016-11-24: qty 1

## 2016-11-24 MED ORDER — CHLORDIAZEPOXIDE HCL 25 MG PO CAPS: ORAL_CAPSULE | ORAL | 0 refills | Status: DC

## 2016-11-24 MED ORDER — THIAMINE HCL 100 MG/ML IJ SOLN
Freq: Once | INTRAVENOUS | Status: AC
  Administered 2016-11-24: 18:00:00 via INTRAVENOUS
  Filled 2016-11-24: qty 1000

## 2016-11-24 MED ORDER — SODIUM CHLORIDE 0.9 % IV BOLUS (SEPSIS)
1000.0000 mL | Freq: Once | INTRAVENOUS | Status: AC
  Administered 2016-11-24: 1000 mL via INTRAVENOUS

## 2016-11-24 NOTE — ED Notes (Signed)
Patient verbalized understanding of discharge instructions and denies any further needs or questions at this time. VS stable. Patient ambulatory with steady gait.  

## 2016-11-24 NOTE — Discharge Instructions (Signed)
Follow up for alcohol treatment.
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
## 2016-11-24 NOTE — ED Triage Notes (Addendum)
States has  Had of etoh today beer and vodka, is nauseated wants detox , pt agitated at triage

## 2017-03-06 ENCOUNTER — Emergency Department (HOSPITAL_COMMUNITY)
Admission: EM | Admit: 2017-03-06 | Discharge: 2017-03-07 | Disposition: A | Discharge status: Home / Self Care | Payer: Medicaid Other | Attending: Emergency Medicine | Admitting: Emergency Medicine

## 2017-03-06 ENCOUNTER — Encounter (HOSPITAL_COMMUNITY): Payer: Self-pay

## 2017-03-06 DIAGNOSIS — R112 Nausea with vomiting, unspecified: Secondary | ICD-10-CM | POA: Insufficient documentation

## 2017-03-06 DIAGNOSIS — J449 Chronic obstructive pulmonary disease, unspecified: Secondary | ICD-10-CM | POA: Insufficient documentation

## 2017-03-06 DIAGNOSIS — E119 Type 2 diabetes mellitus without complications: Secondary | ICD-10-CM | POA: Insufficient documentation

## 2017-03-06 DIAGNOSIS — F1410 Cocaine abuse, uncomplicated: Secondary | ICD-10-CM | POA: Diagnosis not present

## 2017-03-06 DIAGNOSIS — Z79899 Other long term (current) drug therapy: Secondary | ICD-10-CM | POA: Insufficient documentation

## 2017-03-06 DIAGNOSIS — F1910 Other psychoactive substance abuse, uncomplicated: Secondary | ICD-10-CM

## 2017-03-06 DIAGNOSIS — F10920 Alcohol use, unspecified with intoxication, uncomplicated: Secondary | ICD-10-CM

## 2017-03-06 DIAGNOSIS — R1013 Epigastric pain: Secondary | ICD-10-CM | POA: Insufficient documentation

## 2017-03-06 DIAGNOSIS — Z7982 Long term (current) use of aspirin: Secondary | ICD-10-CM | POA: Diagnosis not present

## 2017-03-06 DIAGNOSIS — I2510 Atherosclerotic heart disease of native coronary artery without angina pectoris: Secondary | ICD-10-CM | POA: Insufficient documentation

## 2017-03-06 DIAGNOSIS — F17200 Nicotine dependence, unspecified, uncomplicated: Secondary | ICD-10-CM | POA: Insufficient documentation

## 2017-03-06 DIAGNOSIS — F10120 Alcohol abuse with intoxication, uncomplicated: Secondary | ICD-10-CM | POA: Diagnosis not present

## 2017-03-06 DIAGNOSIS — I10 Essential (primary) hypertension: Secondary | ICD-10-CM | POA: Diagnosis not present

## 2017-03-06 DIAGNOSIS — Z720 Tobacco use: Secondary | ICD-10-CM

## 2017-03-06 LAB — COMPREHENSIVE METABOLIC PANEL
ALBUMIN: 4 g/dL (ref 3.5–5.0)
ALK PHOS: 155 U/L — AB (ref 38–126)
ALT: 50 U/L (ref 17–63)
AST: 31 U/L (ref 15–41)
Anion gap: 13 (ref 5–15)
BILIRUBIN TOTAL: 0.5 mg/dL (ref 0.3–1.2)
BUN: 9 mg/dL (ref 6–20)
CALCIUM: 8.7 mg/dL — AB (ref 8.9–10.3)
CO2: 21 mmol/L — ABNORMAL LOW (ref 22–32)
CREATININE: 0.82 mg/dL (ref 0.61–1.24)
Chloride: 101 mmol/L (ref 101–111)
GFR calc Af Amer: >60 mL/min (ref >60)
GFR calc non Af Amer: >60 mL/min (ref >60)
GLUCOSE: 139 mg/dL — AB (ref 65–99)
POTASSIUM: 3.4 mmol/L — AB (ref 3.5–5.1)
Sodium: 135 mmol/L (ref 135–145)
TOTAL PROTEIN: 8.1 g/dL (ref 6.5–8.1)

## 2017-03-06 LAB — CBC
HCT: 44.6 % (ref 39.0–52.0)
HEMOGLOBIN: 15 g/dL (ref 13.0–17.0)
MCH: 30 pg (ref 26.0–34.0)
MCHC: 33.6 g/dL (ref 30.0–36.0)
MCV: 89.2 fL (ref 78.0–100.0)
Platelets: 327 10*3/uL (ref 150–400)
RBC: 5 MIL/uL (ref 4.22–5.81)
RDW: 14.6 % (ref 11.5–15.5)
WBC: 12.5 10*3/uL — ABNORMAL HIGH (ref 4.0–10.5)

## 2017-03-06 LAB — ETHANOL: Alcohol, Ethyl (B): 182 mg/dL — ABNORMAL HIGH (ref <5)

## 2017-03-06 LAB — RAPID URINE DRUG SCREEN, HOSP PERFORMED
Amphetamines: NOT DETECTED
Barbiturates: NOT DETECTED
Benzodiazepines: NOT DETECTED
Cocaine: POSITIVE — AB
OPIATES: NOT DETECTED
TETRAHYDROCANNABINOL: NOT DETECTED

## 2017-03-06 LAB — LIPASE, BLOOD: Lipase: 30 U/L (ref 11–51)

## 2017-03-06 MED ORDER — SODIUM CHLORIDE 0.9 % IV BOLUS (SEPSIS)
1000.0000 mL | Freq: Once | INTRAVENOUS | Status: AC
  Administered 2017-03-06: 1000 mL via INTRAVENOUS

## 2017-03-06 MED ORDER — ONDANSETRON HCL 4 MG/2ML IJ SOLN
4.0000 mg | Freq: Once | INTRAMUSCULAR | Status: AC
  Administered 2017-03-06: 4 mg via INTRAVENOUS
  Filled 2017-03-06: qty 2
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
## 2017-03-06 NOTE — ED Notes (Signed)
Patient provided with ice chips, tolerating well.

## 2017-03-07 MED ORDER — IBUPROFEN 400 MG PO TABS
600.0000 mg | ORAL_TABLET | Freq: Once | ORAL | Status: AC
  Administered 2017-03-07: 600 mg via ORAL
  Filled 2017-03-07: qty 1

## 2017-03-07 MED ORDER — FAMOTIDINE 20 MG PO TABS
20.0000 mg | ORAL_TABLET | Freq: Once | ORAL | Status: AC
  Administered 2017-03-07: 20 mg via ORAL
  Filled 2017-03-07: qty 1

## 2017-03-07 NOTE — ED Notes (Signed)
Patient verbalized understanding of discharge instructions and denies any further needs or questions at this time. VS stable. Patient ambulatory with steady gait.  

## 2017-03-07 NOTE — Discharge Instructions (Signed)
Substance Abuse Treatment Programs ° °Intensive Outpatient Programs °[facility]     °[address]      °[place], [place]                   °[phone]      ° °[facility] °[address] °[place], [place] °[phone] ° °[facility]     °(Inpatient and outpatient)     °[address]           °[phone]   ° °[facility] °[phone] (Suboxone and Methadone) ° °[address]      °[place], [place] [address]      °[phone]      ° °[address] °[place], [place] °852-3033 ° °[facility] (Outpatient/Inpatient, Chemical)    °(insurance only) [phone]      °       °[facility] (Groups & Residential) °[place], [place] °[phone] ° °   °[facility]     °[address]     °[place], [place]      °[phone]      ° °[facility] (for caregivers and family) °[address] °[place], [place] °336-299-4655 ° ° ° ° ° °Residential Treatment Programs °[facility]      °[address], [place], [place] [address]  °[phone]      ° °[facility] °[address] [place], [place] [address] °[phone] ° °[facility]        °[phone]      ° °[facility] °1-800-659-3381 ° °[facility]             °[address]                                         °[place], [place]                                                °[phone] or [phone]                              ° °[facility] [place] °[address] °[place], [address] °[phone] ° °[facility]    °[address]      °[place], [place]     °[phone]      ° °[facility] °[address] °[place], [place] °[phone] ° °[facility]   °[address]     °[place], [place] [address]     °[phone]      °Admissions: 8am-3pm M-F ° °[facility] °[address] °[place] °336-227-7417 ° °[facility]: Residential Program (90 Days)   °[place], [place]      °336-725-8389 or [phone]    ° °[facility] °[place], [place] °(Walk in Hours over the weekend or by referral) ° °[facility] °[address], [place],  [address] °([phone] ° °Crisis Mobile: [facility]:  1-877-626-1772 (for crisis response 24 hours a day) °[facility] Hotline:      1-800-256-2452 °Outpatient Psychiatry and Counseling ° °[facility]: Mobile Crisis   Management 24 hours:  1-877-626-1772 ° °[facility] sliding scale fee and walk in schedule: M-F 8am-12pm/1pm-3pm °[address]  °[place], [place] [address] °[phone] ° °[facility] °[address] °[place], [place] [address] °[phone] ° °[facility] ([facility] known [facility])- new patient walk-in appointments available Monday - Friday 8am -3pm.          °[address] °[place], [place] [address] °336-676-6840 or crisis line- 336-676-6905 ° °[facility]/ Intensive Outpatient Therapy Program °[address] °[place], [place] [address] °336-832-9804 ° °[facility]                  °Crisis Services      °[phone]      °[address]     °[place], [place] [address]                ° °[facility]   °[facility] °[phone] °[address] °[place], [place] [address] ° ° °[facility]          °[address],  °[place], [place] [address]       °([phone] ° °[facility] °[address] °[place], [place] [address] °336-292-1510 ° °[facility]    °[address]    °[place], [place] [address]     °[phone]      ° °Maisel McKinney, MD     °[address]     °[place] [address]     °336-282-1251     °  °[facility] °[address] °[place] [address] ° °[facility]     °[address]     °[place], [place]      °336-542-2076      ° °[facility] °Shamsher Ahluwalia, MD °[address] °[place], [place] [address] °[phone] ° °[facility]     °[address]     °[place], [place] [address]     °336-274-1237      ° °[facility] °[address] °[place], [place] [address] °336-854-4450 °Resources for Temporary Residential Assistance/Crisis Centers ° °DAY CENTERS °[facility] °M-F 8am-3pm   °[address] [place], [place] [address]   336-332-0824 °Services include: laundry, barbering, support groups, case management, phone  & computer access, showers, AA/NA mtgs, mental health/substance abuse nurse, job skills class, disability information, VA assistance, spiritual classes, etc.  ° °HOMELESS SHELTERS ° °[facility]     °[facility]   °[address], [place]     °[phone]       °       °[facility] (women and children)       °[address] °, [place] [address] °336-275-0820 °[email or web address] for application and process °Application Required ° °[facility]   °[address]    °[place]  [address]     °[phone]       °             °[facility] °[address] °,  [address] °336.273.5572 °336-235-0363(schedule application appt.) °Application Required ° °[facility] (women only)    °[address]     °[place],  [address]     °336-884-1039      °  Intake starts 6pm daily °Need valid ID, SSC, & Police report °[facility] [place] °[address] °[place], [place] °336-881-5420 °Application Required ° °[facility] (men only)     °[address]      °[place], [place]     °[phone]      ° °[facility] °(Pregnant women only) °[address] °[place], [place] °336-275-0206 ° °[facility]      °[address]      °[place], [place] [address]     °[phone]      °       °[facility] °[address] °[place], [place] °336-723-1848 °90 day commitment/SA/Application process ° °[facility](men only)     °[address]     °[place], [place]     °[phone]       °Check-in at 7pm     °       °[facility] °[address] °[place], [place] [address] °336-248-6684 °Men/Women/Women and Children must be there by 7 pm ° °[facility] °[place],  °336-722-8721                ° °

## 2017-07-21 DIAGNOSIS — R55 Syncope and collapse: Secondary | ICD-10-CM

## 2017-07-21 DIAGNOSIS — F1010 Alcohol abuse, uncomplicated: Secondary | ICD-10-CM

## 2017-07-21 DIAGNOSIS — J449 Chronic obstructive pulmonary disease, unspecified: Secondary | ICD-10-CM

## 2017-07-21 DIAGNOSIS — Z720 Tobacco use: Secondary | ICD-10-CM

## 2017-07-21 DIAGNOSIS — K838 Other specified diseases of biliary tract: Secondary | ICD-10-CM

## 2017-07-22 DIAGNOSIS — F1010 Alcohol abuse, uncomplicated: Secondary | ICD-10-CM | POA: Diagnosis not present

## 2017-07-22 DIAGNOSIS — K838 Other specified diseases of biliary tract: Secondary | ICD-10-CM | POA: Diagnosis not present

## 2017-07-22 DIAGNOSIS — Z720 Tobacco use: Secondary | ICD-10-CM | POA: Diagnosis not present

## 2017-07-22 DIAGNOSIS — J449 Chronic obstructive pulmonary disease, unspecified: Secondary | ICD-10-CM | POA: Diagnosis not present

## 2017-07-22 DIAGNOSIS — R55 Syncope and collapse: Secondary | ICD-10-CM | POA: Diagnosis not present

## 2017-08-17 ENCOUNTER — Telehealth: Payer: Self-pay | Admitting: Cardiology

## 2017-08-17 NOTE — Telephone Encounter (Signed)
Attempted to call the pt back and no answer and message said "network difficulties."

## 2017-08-17 NOTE — Telephone Encounter (Signed)
New Message    Pt c/o BP issue: STAT if pt c/o blurred vision, one-sided weakness or slurred speech  1. What are your last 5 BP readings?  117/72  2. Are you having any other symptoms (ex. Dizziness, headache, blurred vision, passed out)?  no  3. What is your BP issue? Pt just had hernia surgery and he is concerned since is bp is so low

## 2017-08-18 NOTE — Telephone Encounter (Signed)
Attempted to call the pt at 3 contact numbers provided.  Both mobile phones are disconnected.  Called the home number listed and the brother-in-law stated this was "his number, not the pts." Brother-in-law endorsed the pts 2nd mobile number [phone]), that wasn't on pt file, and this number was disconnected.  Advised the brother-in-law that if he speaks to the pt, to have him call our office back with call placed to [place] yesterday.  Brother-in-law verbalized understanding and agreed to plan.  Will follow-up as needed, when pt returns a call back.

## 2017-12-18 ENCOUNTER — Telehealth: Payer: Self-pay | Admitting: Cardiology

## 2017-12-18 NOTE — Telephone Encounter (Signed)
Wesley Howell is calling about his blood pressure and the medication in which he was taking . He was in the hospital and they switch his medication(Clonidine 0.2 mg tablet)  while in the hospital and told him to go back to what he was taking( Amlodipine 10 mg once a day )  after he left the hospital . His blood pressure is running high. Does not want to take the Clonidine states it makes him feel funny .  Wants to know what should he do?  Thanks

## 2017-12-18 NOTE — Telephone Encounter (Signed)
Spoke with the pt who is complaining of HTN, for several weeks now.  Pt states he went to the hospital at [facility] for GI issues about a week ago, and they changed his BP regimen from amlodipine to clonidine.  Pt states he cannot tolerate clonidine, for this makes him feel light-headed.  Pt states he switched back to his old regimen of amlodipine 10 mg po daily.  Pt states that his BP today is 168/80 HR 63.  Pt states he needs to get in to be seen by someone in our office, for his is 2 years overdue, and he would like for his HTN issues to be addressed. Scheduled the pt to see Cecilie Kicks NP for this Wednesday 3/13 at 2 pm.  Pt aware to arrive 15 mins prior to this office visit.  Advised the pt that in the meantime, if his BP elevates to a critical level and he becomes symptomatic (eudcated the pt on symptoms to be concerned about), then he should refer to the ER at [facility], for further eval.  Advised the pt to limit his alcohol intake, maintain a low sodium diet, stay plenty hydrated with water, take his meds as prescribed, and reduce his smoking if possible.  Informed the pt that I will route this message to Dr Meda Coffee as an Juluis Rainier.  Pt verbalized understanding and agrees with this plan.

## 2017-12-20 ENCOUNTER — Ambulatory Visit: Payer: Medicaid Other | Admitting: Physician Assistant

## 2017-12-20 ENCOUNTER — Encounter: Payer: Self-pay | Admitting: Physician Assistant

## 2017-12-20 ENCOUNTER — Encounter (INDEPENDENT_AMBULATORY_CARE_PROVIDER_SITE_OTHER): Payer: Self-pay

## 2017-12-20 VITALS — BP 150/86 | HR 93 | Ht 65.0 in | Wt 141.0 lb

## 2017-12-20 DIAGNOSIS — J449 Chronic obstructive pulmonary disease, unspecified: Secondary | ICD-10-CM | POA: Diagnosis not present

## 2017-12-20 DIAGNOSIS — I10 Essential (primary) hypertension: Secondary | ICD-10-CM

## 2017-12-20 DIAGNOSIS — E785 Hyperlipidemia, unspecified: Secondary | ICD-10-CM

## 2017-12-20 DIAGNOSIS — R002 Palpitations: Secondary | ICD-10-CM

## 2017-12-20 DIAGNOSIS — I2510 Atherosclerotic heart disease of native coronary artery without angina pectoris: Secondary | ICD-10-CM

## 2017-12-20 DIAGNOSIS — R202 Paresthesia of skin: Secondary | ICD-10-CM

## 2017-12-20 DIAGNOSIS — Z720 Tobacco use: Secondary | ICD-10-CM

## 2017-12-20 DIAGNOSIS — R7303 Prediabetes: Secondary | ICD-10-CM

## 2017-12-20 MED ORDER — LOSARTAN POTASSIUM 25 MG PO TABS: 25.0000 mg | ORAL_TABLET | Freq: Every day | ORAL | 5 refills | Status: DC

## 2017-12-20 NOTE — Patient Instructions (Addendum)
Medication Instructions: Your physician has recommended you make the following change in your medication:  -1) START Losartan 25 mg - Take 0.5 tablet (12.5 mg) by mouth daily for 5 days and record your daily Blood Pressures. IF YOUR BLOOD PRESSURE IS CONSISTENTLY OVER 145 (top number) INCREASE TO 1 tablet (25 mg) by mouth daily.   Labwork: Your physician has recommended that you have lab work today: BMET, CBC, Lipids, TSH, HgB A1C, and Magnesium Panel  Procedures/Testing: Your physician has recommended that you wear a 48 hour holter monitor. Holter monitors are medical devices that record the heart's electrical activity. Doctors most often use these monitors to diagnose arrhythmias. Arrhythmias are problems with the speed or rhythm of the heartbeat. The monitor is a small, portable device. You can wear one while you do your normal daily activities. This is usually used to diagnose what is causing palpitations/syncope (passing out).  Follow-Up: Your physician wants you to follow-up in: 1 YEAR with Dr. Meda Coffee. You will receive a reminder letter in the mail two months in advance. If you don't receive a letter, please call our office to schedule the follow-up appointment.   If you need a refill on your cardiac medications before your next appointment, please call your pharmacy.   - PLEASE TRY TO WALK AT LEAST [place] -
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
## 2017-12-21 ENCOUNTER — Other Ambulatory Visit: Payer: Self-pay

## 2017-12-21 DIAGNOSIS — E785 Hyperlipidemia, unspecified: Secondary | ICD-10-CM

## 2017-12-21 LAB — LIPID PANEL
CHOLESTEROL TOTAL: 186 mg/dL (ref 100–199)
Chol/HDL Ratio: 2.6 ratio (ref 0.0–5.0)
HDL: 72 mg/dL (ref >39)
LDL CALC: 90 mg/dL (ref 0–99)
TRIGLYCERIDES: 119 mg/dL (ref 0–149)
VLDL CHOLESTEROL CAL: 24 mg/dL (ref 5–40)

## 2017-12-21 LAB — BASIC METABOLIC PANEL
BUN / CREAT RATIO: 7 — AB (ref 9–20)
BUN: 6 mg/dL (ref 6–24)
CHLORIDE: 104 mmol/L (ref 96–106)
CO2: 22 mmol/L (ref 20–29)
CREATININE: 0.84 mg/dL (ref 0.76–1.27)
Calcium: 9.1 mg/dL (ref 8.7–10.2)
GFR calc non Af Amer: 100 mL/min/{1.73_m2} (ref >59)
GFR, EST AFRICAN AMERICAN: 116 mL/min/{1.73_m2} (ref >59)
Glucose: 97 mg/dL (ref 65–99)
Potassium: 3.3 mmol/L — ABNORMAL LOW (ref 3.5–5.2)
Sodium: 142 mmol/L (ref 134–144)

## 2017-12-21 LAB — CBC WITH DIFFERENTIAL/PLATELET
BASOS ABS: 0 10*3/uL (ref 0.0–0.2)
Basos: 0 %
EOS (ABSOLUTE): 0.1 10*3/uL (ref 0.0–0.4)
EOS: 1 %
HEMOGLOBIN: 14.3 g/dL (ref 13.0–17.7)
Hematocrit: 43.6 % (ref 37.5–51.0)
IMMATURE GRANS (ABS): 0 10*3/uL (ref 0.0–0.1)
Immature Granulocytes: 0 %
Lymphocytes Absolute: 2.1 10*3/uL (ref 0.7–3.1)
Lymphs: 19 %
MCH: 30.4 pg (ref 26.6–33.0)
MCHC: 32.8 g/dL (ref 31.5–35.7)
MCV: 93 fL (ref 79–97)
MONOCYTES: 10 %
Monocytes Absolute: 1.1 10*3/uL — ABNORMAL HIGH (ref 0.1–0.9)
Neutrophils Absolute: 7.7 10*3/uL — ABNORMAL HIGH (ref 1.4–7.0)
Neutrophils: 70 %
Platelets: 242 10*3/uL (ref 150–379)
RBC: 4.71 x10E6/uL (ref 4.14–5.80)
RDW: 15.5 % — ABNORMAL HIGH (ref 12.3–15.4)
WBC: 11 10*3/uL — AB (ref 3.4–10.8)

## 2017-12-21 LAB — HEMOGLOBIN A1C
Est. average glucose Bld gHb Est-mCnc: 134 mg/dL
Hgb A1c MFr Bld: 6.3 % — ABNORMAL HIGH (ref 4.8–5.6)

## 2017-12-21 LAB — TSH: TSH: 0.762 u[IU]/mL (ref 0.450–4.500)

## 2017-12-21 LAB — MAGNESIUM: Magnesium: 1.9 mg/dL (ref 1.6–2.3)

## 2017-12-21 MED ORDER — ATORVASTATIN CALCIUM 80 MG PO TABS: 80.0000 mg | ORAL_TABLET | Freq: Every day | ORAL | 3 refills | Status: DC

## 2018-01-01 ENCOUNTER — Other Ambulatory Visit: Payer: Self-pay | Admitting: Cardiology

## 2018-01-01 MED ORDER — AMLODIPINE BESYLATE 10 MG PO TABS: 10.0000 mg | ORAL_TABLET | Freq: Every day | ORAL | 3 refills | Status: AC

## 2018-01-01 MED ORDER — LOSARTAN POTASSIUM 25 MG PO TABS: 25.0000 mg | ORAL_TABLET | Freq: Every day | ORAL | 3 refills | Status: AC

## 2018-01-01 MED ORDER — ATORVASTATIN CALCIUM 80 MG PO TABS: 80.0000 mg | ORAL_TABLET | Freq: Every day | ORAL | 3 refills | Status: AC

## 2018-01-01 NOTE — Telephone Encounter (Signed)
Pt's medication was sent to pt's pharmacy as requested. Confirmation received.  °

## 2018-01-01 NOTE — Telephone Encounter (Signed)
New message    *STAT* If patient is at the pharmacy, call can be transferred to refill team.   1. Which medications need to be refilled? (please list name of each medication and dose if known) amLODipine (NORVASC) 10 MG tablet, losartan (COZAAR) 25 MG tablet, and atorvastatin (LIPITOR) 80 MG tablet  2. Which pharmacy/location (including street and city if local pharmacy) is medication to be sent to? [place], [place]  3. Do they need a 30 day or 90 day supply? [place]

## 2018-02-20 ENCOUNTER — Other Ambulatory Visit: Payer: Self-pay

## 2018-09-12 ENCOUNTER — Emergency Department (HOSPITAL_COMMUNITY): Payer: Medicaid Other

## 2018-09-12 ENCOUNTER — Other Ambulatory Visit: Payer: Self-pay

## 2018-09-12 ENCOUNTER — Emergency Department (HOSPITAL_COMMUNITY)
Admission: EM | Admit: 2018-09-12 | Discharge: 2018-09-12 | Disposition: A | Discharge status: Home / Self Care | Payer: Medicaid Other | Attending: Emergency Medicine | Admitting: Emergency Medicine

## 2018-09-12 ENCOUNTER — Encounter (HOSPITAL_COMMUNITY): Payer: Self-pay | Admitting: Emergency Medicine

## 2018-09-12 DIAGNOSIS — E119 Type 2 diabetes mellitus without complications: Secondary | ICD-10-CM | POA: Insufficient documentation

## 2018-09-12 DIAGNOSIS — F17200 Nicotine dependence, unspecified, uncomplicated: Secondary | ICD-10-CM | POA: Insufficient documentation

## 2018-09-12 DIAGNOSIS — J189 Pneumonia, unspecified organism: Secondary | ICD-10-CM | POA: Insufficient documentation

## 2018-09-12 DIAGNOSIS — Z79899 Other long term (current) drug therapy: Secondary | ICD-10-CM | POA: Insufficient documentation

## 2018-09-12 DIAGNOSIS — I10 Essential (primary) hypertension: Secondary | ICD-10-CM | POA: Insufficient documentation

## 2018-09-12 DIAGNOSIS — J449 Chronic obstructive pulmonary disease, unspecified: Secondary | ICD-10-CM | POA: Diagnosis not present

## 2018-09-12 DIAGNOSIS — Z7982 Long term (current) use of aspirin: Secondary | ICD-10-CM | POA: Diagnosis not present

## 2018-09-12 DIAGNOSIS — R05 Cough: Secondary | ICD-10-CM | POA: Diagnosis present

## 2018-09-12 DIAGNOSIS — I2510 Atherosclerotic heart disease of native coronary artery without angina pectoris: Secondary | ICD-10-CM | POA: Insufficient documentation

## 2018-09-12 LAB — CBC WITH DIFFERENTIAL/PLATELET
Abs Immature Granulocytes: 0.02 10*3/uL (ref 0.00–0.07)
BASOS ABS: 0 10*3/uL (ref 0.0–0.1)
Basophils Relative: 0 %
Eosinophils Absolute: 0.1 10*3/uL (ref 0.0–0.5)
Eosinophils Relative: 3 %
HCT: 42.7 % (ref 39.0–52.0)
Hemoglobin: 13.7 g/dL (ref 13.0–17.0)
IMMATURE GRANULOCYTES: 0 %
Lymphocytes Relative: 42 %
Lymphs Abs: 2.2 10*3/uL (ref 0.7–4.0)
MCH: 31 pg (ref 26.0–34.0)
MCHC: 32.1 g/dL (ref 30.0–36.0)
MCV: 96.6 fL (ref 80.0–100.0)
Monocytes Absolute: 0.4 10*3/uL (ref 0.1–1.0)
Monocytes Relative: 8 %
NEUTROS ABS: 2.5 10*3/uL (ref 1.7–7.7)
Neutrophils Relative %: 47 %
Platelets: 285 10*3/uL (ref 150–400)
RBC: 4.42 MIL/uL (ref 4.22–5.81)
RDW: 13.7 % (ref 11.5–15.5)
WBC: 5.2 10*3/uL (ref 4.0–10.5)
nRBC: 0 % (ref 0.0–0.2)

## 2018-09-12 LAB — CG4 I-STAT (LACTIC ACID)
Lactic Acid, Venous: 1.13 mmol/L (ref 0.5–1.9)
Lactic Acid, Venous: 1.43 mmol/L (ref 0.5–1.9)

## 2018-09-12 LAB — COMPREHENSIVE METABOLIC PANEL
ALT: 18 U/L (ref 0–44)
AST: 26 U/L (ref 15–41)
Albumin: 3.8 g/dL (ref 3.5–5.0)
Alkaline Phosphatase: 110 U/L (ref 38–126)
Anion gap: 8 (ref 5–15)
BUN: 7 mg/dL (ref 6–20)
CO2: 28 mmol/L (ref 22–32)
Calcium: 8.7 mg/dL — ABNORMAL LOW (ref 8.9–10.3)
Chloride: 99 mmol/L (ref 98–111)
Creatinine, Ser: 0.74 mg/dL (ref 0.61–1.24)
GFR calc Af Amer: >60 mL/min (ref >60)
GFR calc non Af Amer: >60 mL/min (ref >60)
Glucose, Bld: 119 mg/dL — ABNORMAL HIGH (ref 70–99)
Potassium: 3.9 mmol/L (ref 3.5–5.1)
Sodium: 135 mmol/L (ref 135–145)
Total Bilirubin: 0.5 mg/dL (ref 0.3–1.2)
Total Protein: 7.9 g/dL (ref 6.5–8.1)

## 2018-09-12 MED ORDER — AMOXICILLIN-POT CLAVULANATE 875-125 MG PO TABS: 1.0000 | ORAL_TABLET | Freq: Two times a day (BID) | ORAL | 0 refills | Status: AC

## 2018-09-12 MED ORDER — LEVALBUTEROL HCL 0.63 MG/3ML IN NEBU
0.6300 mg | INHALATION_SOLUTION | Freq: Once | RESPIRATORY_TRACT | Status: AC
  Administered 2018-09-12: 0.63 mg via RESPIRATORY_TRACT
  Filled 2018-09-12: qty 3

## 2018-09-12 MED ORDER — PREDNISONE 20 MG PO TABS: 40.0000 mg | ORAL_TABLET | Freq: Every day | ORAL | 0 refills | Status: AC

## 2018-09-12 MED ORDER — IPRATROPIUM-ALBUTEROL 0.5-2.5 (3) MG/3ML IN SOLN
3.0000 mL | Freq: Once | RESPIRATORY_TRACT | Status: AC
  Administered 2018-09-12: 3 mL via RESPIRATORY_TRACT
  Filled 2018-09-12: qty 3

## 2018-09-12 MED ORDER — DOXYCYCLINE HYCLATE 100 MG PO CAPS: 100.0000 mg | ORAL_CAPSULE | Freq: Two times a day (BID) | ORAL | 0 refills | Status: AC

## 2018-09-12 NOTE — Discharge Instructions (Addendum)
Have prescribed muscle relaxers for your pain, please do not drink or drive while taking this medications as they can make you drowsy.  I have also prescribed steroids, he is to be advised this medication can cause insomnia, appetite changes.  These follow-up with PCP in 1 week for reevaluation of your symptoms.  You experience any bowel or bladder incontinence, fever, worsening in your symptoms please return to the ED.
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
## 2018-09-12 NOTE — ED Triage Notes (Addendum)
Patient c/o productive cough x1 week. Hx COPD. Denies new SOB. C/o left shoulder pain x3 weeks. Denies injury. Hx chronic pain. Patient speaking in full sentences.

## 2019-07-16 DIAGNOSIS — Z720 Tobacco use: Secondary | ICD-10-CM

## 2019-07-16 DIAGNOSIS — F1010 Alcohol abuse, uncomplicated: Secondary | ICD-10-CM

## 2019-07-16 DIAGNOSIS — D491 Neoplasm of unspecified behavior of respiratory system: Secondary | ICD-10-CM

## 2019-07-16 DIAGNOSIS — J441 Chronic obstructive pulmonary disease with (acute) exacerbation: Secondary | ICD-10-CM

## 2019-07-16 DIAGNOSIS — I10 Essential (primary) hypertension: Secondary | ICD-10-CM

## 2019-07-17 DIAGNOSIS — I10 Essential (primary) hypertension: Secondary | ICD-10-CM | POA: Diagnosis not present

## 2019-07-17 DIAGNOSIS — F1010 Alcohol abuse, uncomplicated: Secondary | ICD-10-CM | POA: Diagnosis not present

## 2019-07-17 DIAGNOSIS — D491 Neoplasm of unspecified behavior of respiratory system: Secondary | ICD-10-CM | POA: Diagnosis not present

## 2019-07-17 DIAGNOSIS — J441 Chronic obstructive pulmonary disease with (acute) exacerbation: Secondary | ICD-10-CM | POA: Diagnosis not present

## 2019-12-26 ENCOUNTER — Encounter: Payer: Self-pay | Admitting: Cardiology

## 2019-12-26 DIAGNOSIS — E785 Hyperlipidemia, unspecified: Secondary | ICD-10-CM | POA: Diagnosis not present

## 2019-12-26 DIAGNOSIS — I10 Essential (primary) hypertension: Secondary | ICD-10-CM | POA: Diagnosis not present

## 2019-12-26 DIAGNOSIS — I361 Nonrheumatic tricuspid (valve) insufficiency: Secondary | ICD-10-CM | POA: Diagnosis not present

## 2019-12-26 DIAGNOSIS — I469 Cardiac arrest, cause unspecified: Secondary | ICD-10-CM | POA: Diagnosis not present

## 2019-12-26 DIAGNOSIS — R4189 Other symptoms and signs involving cognitive functions and awareness: Secondary | ICD-10-CM | POA: Diagnosis not present

## 2019-12-26 DIAGNOSIS — J449 Chronic obstructive pulmonary disease, unspecified: Secondary | ICD-10-CM | POA: Diagnosis not present

## 2019-12-26 DIAGNOSIS — C3490 Malignant neoplasm of unspecified part of unspecified bronchus or lung: Secondary | ICD-10-CM

## 2019-12-26 DIAGNOSIS — R092 Respiratory arrest: Secondary | ICD-10-CM | POA: Diagnosis not present

## 2019-12-26 DIAGNOSIS — R778 Other specified abnormalities of plasma proteins: Secondary | ICD-10-CM | POA: Diagnosis not present

## 2019-12-27 DIAGNOSIS — R4189 Other symptoms and signs involving cognitive functions and awareness: Secondary | ICD-10-CM | POA: Diagnosis not present

## 2019-12-27 DIAGNOSIS — I469 Cardiac arrest, cause unspecified: Secondary | ICD-10-CM | POA: Diagnosis not present

## 2019-12-27 DIAGNOSIS — J449 Chronic obstructive pulmonary disease, unspecified: Secondary | ICD-10-CM | POA: Diagnosis not present

## 2019-12-27 DIAGNOSIS — I10 Essential (primary) hypertension: Secondary | ICD-10-CM | POA: Diagnosis not present

## 2020-01-24 ENCOUNTER — Telehealth: Payer: Self-pay | Admitting: Physician Assistant

## 2020-01-24 NOTE — Telephone Encounter (Signed)
Can you please schedule him to be seen on Monday or Tuesday by PA or NP?  He will need labs (CMP, BNP, CBC, lipids, TSH) and repeated echo as well.

## 2020-01-24 NOTE — Telephone Encounter (Signed)
I spoke with patient. He reports swelling in his feet off and on for several years. It has worsened recently. Diagnosed with lung cancer in October 2020. He has been having shortness of breath. Also has COPD. States he was hospitalized at [facility] 3 weeks ago with heart attack.  He reports he is very tired recently. Has been trying to help his fiance move but becomes short of breath with exertion. Reports palpitations at times. Patient was last seen in our office in 2019 and has appointment with Ermalinda Barrios, PA on April 21,2021.  I advised patient to keep this appointment. I told him if shortness of breath worsens, swelling worsens or he has any change in his symptoms he should go to to ED for evaluation

## 2020-01-24 NOTE — Telephone Encounter (Signed)
Pt c/o swelling: STAT is pt has developed SOB within 24 hours  1) How much weight have you gained and in what time span? No  2) If swelling, where is the swelling located? Feet   3) Are you currently taking a fluid pill? No   4) Are you currently SOB? Yes and some dizziness   5) Do you have a log of your daily weights (if so, list)? No  6) Have you gained 3 pounds in a day or 5 pounds in a week? No   7) Have you traveled recently? No

## 2020-01-24 NOTE — Telephone Encounter (Signed)
Yes, thank you.

## 2020-01-24 NOTE — Telephone Encounter (Signed)
Per Dr Meda Coffee patient to be added to DOD schedule. I spoke with patient and scheduled him to see Dr Burt Knack on 01/27/20 at 11:00.  Patient reports difficulty remembering information and states he needs fiance with him during appointment

## 2020-01-27 ENCOUNTER — Telehealth (HOSPITAL_COMMUNITY): Payer: Self-pay | Admitting: *Deleted

## 2020-01-27 ENCOUNTER — Encounter: Payer: Self-pay | Admitting: Cardiovascular Disease

## 2020-01-27 ENCOUNTER — Ambulatory Visit: Payer: Medicaid Other | Admitting: Cardiovascular Disease

## 2020-01-27 ENCOUNTER — Other Ambulatory Visit: Payer: Self-pay

## 2020-01-27 VITALS — BP 114/72 | HR 81 | Ht 65.0 in | Wt 151.0 lb

## 2020-01-27 DIAGNOSIS — I2510 Atherosclerotic heart disease of native coronary artery without angina pectoris: Secondary | ICD-10-CM

## 2020-01-27 DIAGNOSIS — R0989 Other specified symptoms and signs involving the circulatory and respiratory systems: Secondary | ICD-10-CM | POA: Diagnosis not present

## 2020-01-27 DIAGNOSIS — R0602 Shortness of breath: Secondary | ICD-10-CM

## 2020-01-27 NOTE — Telephone Encounter (Signed)
Patient given detailed instructions per Myocardial Perfusion Study Information Sheet for the test on 01/29/20 at 10:15. Patient notified to arrive 15 minutes early and that it is imperative to arrive on time for appointment to keep from having the test rescheduled.  If you need to cancel or reschedule your appointment, please call the office within 24 hours of your appointment. . Patient verbalized understanding.Wesley Howell

## 2020-01-27 NOTE — Patient Instructions (Signed)
Medication Instructions:  Your provider recommends that you continue on your current medications as directed. Please refer to the Current Medication list given to you today.   *If you need a refill on your cardiac medications before your next appointment, please call your pharmacy*  Lab Work: TODAY: BNP, CMET, TSH, CBC If you have labs (blood work) drawn today and your tests are completely normal, you will receive your results only by: [name] MyChart Message (if you have MyChart) OR . A paper copy in the mail If you have any lab test that is abnormal or we need to change your treatment, we will call you to review the results.  Testing/Procedures: Your physician has requested that you have a carotid duplex. This test is an ultrasound of the carotid arteries in your neck. It looks at blood flow through these arteries that supply the brain with blood. Allow one hour for this exam. There are no restrictions or special instructions.  Your provider has requested that you have a lexiscan myoview. For further information please visit [email or web address]. Please follow instruction sheet, as given.  Follow-Up: At [facility], you and your health needs are our priority.  As part of our continuing mission to provide you with exceptional heart care, we have created designated Provider Care Teams.  These Care Teams include your primary Cardiologist (physician) and Advanced Practice Providers (APPs -  Physician Assistants and Nurse Practitioners) who all work together to provide you with the care you need, when you need it. Your next appointment:   6 week(s) The format for your next appointment:   In Person Provider:   You may see [name], MD or one of the following Advanced Practice Providers on your designated Care Team:    Melina Copa, PA-C  Ermalinda Barrios, PA-C
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
## 2020-01-28 ENCOUNTER — Telehealth: Payer: Self-pay | Admitting: *Deleted

## 2020-01-28 DIAGNOSIS — R945 Abnormal results of liver function studies: Secondary | ICD-10-CM

## 2020-01-28 DIAGNOSIS — R899 Unspecified abnormal finding in specimens from other organs, systems and tissues: Secondary | ICD-10-CM

## 2020-01-28 DIAGNOSIS — R748 Abnormal levels of other serum enzymes: Secondary | ICD-10-CM

## 2020-01-28 LAB — CBC WITH DIFFERENTIAL/PLATELET
Basophils Absolute: 0 10*3/uL (ref 0.0–0.2)
Basos: 1 %
EOS (ABSOLUTE): 0.1 10*3/uL (ref 0.0–0.4)
Eos: 2 %
Hematocrit: 37.6 % (ref 37.5–51.0)
Hemoglobin: 12.9 g/dL — ABNORMAL LOW (ref 13.0–17.7)
Immature Grans (Abs): 0 10*3/uL (ref 0.0–0.1)
Immature Granulocytes: 1 %
Lymphocytes Absolute: 0.7 10*3/uL (ref 0.7–3.1)
Lymphs: 17 %
MCH: 33.5 pg — ABNORMAL HIGH (ref 26.6–33.0)
MCHC: 34.3 g/dL (ref 31.5–35.7)
MCV: 98 fL — ABNORMAL HIGH (ref 79–97)
Monocytes Absolute: 0.5 10*3/uL (ref 0.1–0.9)
Monocytes: 13 %
Neutrophils Absolute: 2.8 10*3/uL (ref 1.4–7.0)
Neutrophils: 66 %
Platelets: 234 10*3/uL (ref 150–450)
RBC: 3.85 x10E6/uL — ABNORMAL LOW (ref 4.14–5.80)
RDW: 12.6 % (ref 11.6–15.4)
WBC: 4.2 10*3/uL (ref 3.4–10.8)

## 2020-01-28 LAB — COMPREHENSIVE METABOLIC PANEL
ALT: 34 IU/L (ref 0–44)
AST: 42 IU/L — ABNORMAL HIGH (ref 0–40)
Albumin/Globulin Ratio: 1.2 (ref 1.2–2.2)
Albumin: 3.8 g/dL (ref 3.8–4.9)
Alkaline Phosphatase: 207 IU/L — ABNORMAL HIGH (ref 39–117)
BUN/Creatinine Ratio: 11 (ref 9–20)
BUN: 9 mg/dL (ref 6–24)
Bilirubin Total: 0.2 mg/dL (ref 0.0–1.2)
CO2: 24 mmol/L (ref 20–29)
Calcium: 9.4 mg/dL (ref 8.7–10.2)
Chloride: 101 mmol/L (ref 96–106)
Creatinine, Ser: 0.84 mg/dL (ref 0.76–1.27)
GFR calc Af Amer: 114 mL/min/{1.73_m2} (ref >59)
GFR calc non Af Amer: 99 mL/min/{1.73_m2} (ref >59)
Globulin, Total: 3.2 g/dL (ref 1.5–4.5)
Glucose: 93 mg/dL (ref 65–99)
Potassium: 4.6 mmol/L (ref 3.5–5.2)
Sodium: 140 mmol/L (ref 134–144)
Total Protein: 7 g/dL (ref 6.0–8.5)

## 2020-01-28 LAB — TSH: TSH: 0.647 u[IU]/mL (ref 0.450–4.500)

## 2020-01-28 LAB — PRO B NATRIURETIC PEPTIDE: NT-Pro BNP: 377 pg/mL — ABNORMAL HIGH (ref 0–210)

## 2020-01-28 NOTE — Telephone Encounter (Signed)
Spoke with the pt and informed him that per Dr. Meda Coffee, his labs showed elevated ALP, and she recommends that we order for him to get a liver US done, to further evaluate this.  Informed the pt that I will place the order in the system, and send our [facility] a message to call him back and arrange.  Pt verbalized understanding and agrees with this plan.

## 2020-01-28 NOTE — Telephone Encounter (Signed)
-----   Message from Dorothy Spark, MD sent at 01/28/2020  7:15 AM EDT ----- Elevated ALP, can you order liver US?Thank you, KN

## 2020-01-29 ENCOUNTER — Ambulatory Visit: Payer: Medicaid Other | Admitting: Physician Assistant

## 2020-01-29 ENCOUNTER — Encounter (HOSPITAL_COMMUNITY): Payer: Medicaid Other

## 2020-01-29 ENCOUNTER — Ambulatory Visit (HOSPITAL_COMMUNITY)
Admission: RE | Admit: 2020-01-29 | Payer: Medicaid Other | Source: Ambulatory Visit | Attending: Cardiology | Admitting: Cardiology

## 2020-01-31 ENCOUNTER — Ambulatory Visit (HOSPITAL_COMMUNITY): Payer: Medicaid Other

## 2020-02-05 ENCOUNTER — Telehealth: Payer: Self-pay | Admitting: *Deleted

## 2020-02-05 NOTE — Telephone Encounter (Signed)
-----   Message from Armando Gang sent at 02/05/2020  3:22 PM EDT ----- Regarding: RE: reschedule liver US Schedule for 9:00 02-19-20 . Stress @ 10:00 then cartiod @ 2  he is aware  ----- Message ----- From: Nuala Alpha, LPN Sent: 02/03/622   2:12 PM EDT To: [facility] Subject: reschedule liver US                            Pt was scheduled for a [place] Abd limited RUQ at [facility] on 4/23 and no showed.  This was ordered by Dr. Meda Coffee for elevated LFTS.  Dr. Meda Coffee would like to try and reschedule this for the pt, for he needs this badly. Can you please reach back out and reschedule at a good time for him?  Thanks for all you do, Karlene Einstein

## 2020-02-12 ENCOUNTER — Encounter (HOSPITAL_COMMUNITY): Payer: Medicaid Other

## 2020-02-12 ENCOUNTER — Telehealth (HOSPITAL_COMMUNITY): Payer: Self-pay | Admitting: *Deleted

## 2020-02-12 MED ORDER — LORAZEPAM 1 MG PO TABS
2.00 | ORAL_TABLET | ORAL | Status: DC
Start: ? — End: 2020-02-12

## 2020-02-12 MED ORDER — ONDANSETRON 4 MG PO TBDP
4.00 | ORAL_TABLET | ORAL | Status: DC
Start: ? — End: 2020-02-12

## 2020-02-12 MED ORDER — FOLIC ACID 1 MG PO TABS
1.00 | ORAL_TABLET | ORAL | Status: DC
Start: ? — End: 2020-02-12

## 2020-02-12 MED ORDER — GENERIC EXTERNAL MEDICATION
2.00 | Status: DC
Start: ? — End: 2020-02-12

## 2020-02-12 MED ORDER — THIAMINE HCL 100 MG PO TABS
100.00 | ORAL_TABLET | ORAL | Status: DC
Start: 2020-02-11 — End: 2020-02-12

## 2020-02-12 MED ORDER — QUINTABS PO TABS
1.00 | ORAL_TABLET | ORAL | Status: DC
Start: ? — End: 2020-02-12

## 2020-02-12 NOTE — Telephone Encounter (Signed)
Patient given detailed instructions per Myocardial Perfusion Study Information Sheet for the test on 02/19/2020 at 1000. Patient notified to arrive 15 minutes early and that it is imperative to arrive on time for appointment to keep from having the test rescheduled.  If you need to cancel or reschedule your appointment, please call the office within 24 hours of your appointment. . Patient verbalized understanding.Wesley Howell, [name]  No Mychart available

## 2020-02-19 ENCOUNTER — Encounter (HOSPITAL_BASED_OUTPATIENT_CLINIC_OR_DEPARTMENT_OTHER): Payer: Medicaid Other

## 2020-02-19 ENCOUNTER — Telehealth: Payer: Self-pay | Admitting: *Deleted

## 2020-02-19 ENCOUNTER — Other Ambulatory Visit (HOSPITAL_COMMUNITY): Payer: Self-pay | Admitting: Cardiology

## 2020-02-19 ENCOUNTER — Ambulatory Visit (HOSPITAL_COMMUNITY)
Admission: RE | Admit: 2020-02-19 | Discharge: 2020-02-19 | Disposition: A | Discharge status: Home / Self Care | Payer: Medicaid Other | Source: Ambulatory Visit | Attending: Cardiology | Admitting: Cardiology

## 2020-02-19 ENCOUNTER — Ambulatory Visit (HOSPITAL_COMMUNITY)
Admission: RE | Admit: 2020-02-19 | Discharge: 2020-02-19 | Disposition: A | Discharge status: Home / Self Care | Payer: Medicaid Other | Source: Ambulatory Visit | Attending: Cardiovascular Disease | Admitting: Cardiovascular Disease

## 2020-02-19 ENCOUNTER — Encounter (HOSPITAL_COMMUNITY): Payer: Medicaid Other

## 2020-02-19 ENCOUNTER — Other Ambulatory Visit: Payer: Self-pay

## 2020-02-19 DIAGNOSIS — R0602 Shortness of breath: Secondary | ICD-10-CM

## 2020-02-19 DIAGNOSIS — I2510 Atherosclerotic heart disease of native coronary artery without angina pectoris: Secondary | ICD-10-CM

## 2020-02-19 DIAGNOSIS — R0989 Other specified symptoms and signs involving the circulatory and respiratory systems: Secondary | ICD-10-CM

## 2020-02-19 DIAGNOSIS — R945 Abnormal results of liver function studies: Secondary | ICD-10-CM | POA: Insufficient documentation

## 2020-02-19 DIAGNOSIS — R748 Abnormal levels of other serum enzymes: Secondary | ICD-10-CM

## 2020-02-19 DIAGNOSIS — R899 Unspecified abnormal finding in specimens from other organs, systems and tissues: Secondary | ICD-10-CM

## 2020-02-19 DIAGNOSIS — I6523 Occlusion and stenosis of bilateral carotid arteries: Secondary | ICD-10-CM

## 2020-02-19 LAB — MYOCARDIAL PERFUSION IMAGING
LV dias vol: 98 mL (ref 62–150)
LV sys vol: 42 mL
Peak HR: 97 {beats}/min
Rest HR: 91 {beats}/min
SDS: 7
SRS: 7
SSS: 15
TID: 1.11

## 2020-02-19 MED ORDER — TECHNETIUM TC 99M TETROFOSMIN IV KIT
31.0000 | PACK | Freq: Once | INTRAVENOUS | Status: AC | PRN
  Administered 2020-02-19: 31 via INTRAVENOUS
  Filled 2020-02-19: qty 31

## 2020-02-19 MED ORDER — TECHNETIUM TC 99M TETROFOSMIN IV KIT
10.7000 | PACK | Freq: Once | INTRAVENOUS | Status: AC | PRN
  Administered 2020-02-19: 10.7 via INTRAVENOUS
  Filled 2020-02-19: qty 11

## 2020-02-19 MED ORDER — REGADENOSON 0.4 MG/5ML IV SOLN
0.4000 mg | Freq: Once | INTRAVENOUS | Status: AC
  Administered 2020-02-19: 0.4 mg via INTRAVENOUS

## 2020-02-19 NOTE — Telephone Encounter (Signed)
-----   Message from Dorothy Spark, MD sent at 02/19/2020  3:23 PM EDT ----- Right Carotid: Velocities in the right ICA are consistent with a 40-59%                stenosis.  Left Carotid: Velocities in the left ICA are consistent with a 60-79% stenosis.  Follow up in 1 year, unless he has any signs of unilateral weakness

## 2020-02-19 NOTE — Telephone Encounter (Signed)
Spoke with the Wesley Howell and informed him of his carotid artery results and recommendations per Dr. Meda Coffee. Asked the Wesley Howell if he has s/s of unilateral weakness.  Wesley Howell states he does not have unilateral weakness, but sometimes his eyes show really bright lights, and floaters.  Wesley Howell states last visit with his Ophthalmologist was over 9 months ago, and his wife will be getting him an appt here soon, for follow-up of this issue.  Informed the Wesley Howell that I will let Dr. Meda Coffee know this.  Also informed the Wesley Howell that I will go ahead and place the order for repeat carotids to be done in one year, and have our [facility] call him back and arrange this.  Wesley Howell verbalized understanding and agrees with this plan.

## 2020-03-10 ENCOUNTER — Telehealth: Payer: Self-pay | Admitting: *Deleted

## 2020-03-10 NOTE — Telephone Encounter (Signed)
Call placed to pt to reschedule appt due to + covid test 03/03/2020. Pt rescheduled for 03/30/2020 @ 2:15.  Pt appreciative of the call.

## 2020-03-10 NOTE — Telephone Encounter (Signed)
-----   Message from Charlie Pitter, [place] sent at 03/09/2020  5:05 PM EDT ----- Regarding: Appt on Thursday FYI - patient tested positive for Covid at Urgent Care on 5/25 per [facility] and required infusion. Will need to r/s visit per office guidelines. Thanks, [employer]

## 2020-03-12 ENCOUNTER — Ambulatory Visit: Payer: Medicaid Other | Admitting: Physician Assistant

## 2020-03-29 ENCOUNTER — Encounter: Payer: Self-pay | Admitting: Physician Assistant
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
## 2020-03-30 ENCOUNTER — Ambulatory Visit: Payer: Medicaid Other | Admitting: Physician Assistant

## 2020-04-11 ENCOUNTER — Other Ambulatory Visit: Payer: Self-pay

## 2020-04-11 ENCOUNTER — Inpatient Hospital Stay (HOSPITAL_COMMUNITY)
Admission: EM | Admit: 2020-04-11 | Discharge: 2020-04-14 | DRG: 191 | Disposition: A | Discharge status: Home / Self Care | Payer: Medicaid Other | Attending: Family Medicine | Admitting: Family Medicine

## 2020-04-11 ENCOUNTER — Encounter (HOSPITAL_COMMUNITY): Payer: Self-pay

## 2020-04-11 ENCOUNTER — Emergency Department (HOSPITAL_COMMUNITY): Payer: Medicaid Other

## 2020-04-11 DIAGNOSIS — I2510 Atherosclerotic heart disease of native coronary artery without angina pectoris: Secondary | ICD-10-CM | POA: Diagnosis present

## 2020-04-11 DIAGNOSIS — Z85118 Personal history of other malignant neoplasm of bronchus and lung: Secondary | ICD-10-CM

## 2020-04-11 DIAGNOSIS — G8929 Other chronic pain: Secondary | ICD-10-CM | POA: Diagnosis not present

## 2020-04-11 DIAGNOSIS — J441 Chronic obstructive pulmonary disease with (acute) exacerbation: Secondary | ICD-10-CM | POA: Diagnosis not present

## 2020-04-11 DIAGNOSIS — C3490 Malignant neoplasm of unspecified part of unspecified bronchus or lung: Secondary | ICD-10-CM

## 2020-04-11 DIAGNOSIS — Z79899 Other long term (current) drug therapy: Secondary | ICD-10-CM

## 2020-04-11 DIAGNOSIS — E785 Hyperlipidemia, unspecified: Secondary | ICD-10-CM | POA: Diagnosis present

## 2020-04-11 DIAGNOSIS — Z888 Allergy status to other drugs, medicaments and biological substances status: Secondary | ICD-10-CM

## 2020-04-11 DIAGNOSIS — F1010 Alcohol abuse, uncomplicated: Secondary | ICD-10-CM

## 2020-04-11 DIAGNOSIS — Z8249 Family history of ischemic heart disease and other diseases of the circulatory system: Secondary | ICD-10-CM

## 2020-04-11 DIAGNOSIS — Z20822 Contact with and (suspected) exposure to covid-19: Secondary | ICD-10-CM | POA: Diagnosis present

## 2020-04-11 DIAGNOSIS — Z9221 Personal history of antineoplastic chemotherapy: Secondary | ICD-10-CM

## 2020-04-11 DIAGNOSIS — Z9119 Patient's noncompliance with other medical treatment and regimen: Secondary | ICD-10-CM

## 2020-04-11 DIAGNOSIS — F17200 Nicotine dependence, unspecified, uncomplicated: Secondary | ICD-10-CM | POA: Diagnosis present

## 2020-04-11 DIAGNOSIS — Z8616 Personal history of COVID-19: Secondary | ICD-10-CM

## 2020-04-11 DIAGNOSIS — Z7982 Long term (current) use of aspirin: Secondary | ICD-10-CM

## 2020-04-11 DIAGNOSIS — Z923 Personal history of irradiation: Secondary | ICD-10-CM

## 2020-04-11 DIAGNOSIS — F10229 Alcohol dependence with intoxication, unspecified: Secondary | ICD-10-CM | POA: Diagnosis present

## 2020-04-11 DIAGNOSIS — M797 Fibromyalgia: Secondary | ICD-10-CM | POA: Diagnosis present

## 2020-04-11 DIAGNOSIS — F10239 Alcohol dependence with withdrawal, unspecified: Secondary | ICD-10-CM | POA: Diagnosis present

## 2020-04-11 DIAGNOSIS — F418 Other specified anxiety disorders: Secondary | ICD-10-CM | POA: Diagnosis present

## 2020-04-11 DIAGNOSIS — E119 Type 2 diabetes mellitus without complications: Secondary | ICD-10-CM | POA: Diagnosis present

## 2020-04-11 DIAGNOSIS — W19XXXA Unspecified fall, initial encounter: Secondary | ICD-10-CM | POA: Diagnosis present

## 2020-04-11 DIAGNOSIS — I10 Essential (primary) hypertension: Secondary | ICD-10-CM | POA: Diagnosis present

## 2020-04-11 DIAGNOSIS — S022XXA Fracture of nasal bones, initial encounter for closed fracture: Secondary | ICD-10-CM | POA: Diagnosis present

## 2020-04-11 DIAGNOSIS — Z8674 Personal history of sudden cardiac arrest: Secondary | ICD-10-CM

## 2020-04-11 DIAGNOSIS — Z9181 History of falling: Secondary | ICD-10-CM

## 2020-04-11 DIAGNOSIS — S02401A Maxillary fracture, unspecified, initial encounter for closed fracture: Secondary | ICD-10-CM | POA: Diagnosis present

## 2020-04-11 DIAGNOSIS — J449 Chronic obstructive pulmonary disease, unspecified: Secondary | ICD-10-CM

## 2020-04-11 DIAGNOSIS — Z801 Family history of malignant neoplasm of trachea, bronchus and lung: Secondary | ICD-10-CM

## 2020-04-11 DIAGNOSIS — J9611 Chronic respiratory failure with hypoxia: Secondary | ICD-10-CM | POA: Diagnosis present

## 2020-04-11 MED ORDER — THIAMINE HCL 100 MG/ML IJ SOLN: 100.0000 mg | Freq: Every day | INTRAMUSCULAR | Status: DC

## 2020-04-11 MED ORDER — LORAZEPAM 2 MG/ML IJ SOLN
0.0000 mg | Freq: Four times a day (QID) | INTRAMUSCULAR | Status: AC
  Administered 2020-04-12 (×2): 2 mg via INTRAVENOUS
  Administered 2020-04-13: 1 mg via INTRAVENOUS
  Filled 2020-04-11 (×4): qty 1

## 2020-04-11 MED ORDER — OXYCODONE-ACETAMINOPHEN 5-325 MG PO TABS
1.0000 | ORAL_TABLET | Freq: Once | ORAL | Status: AC
  Administered 2020-04-11: 2 via ORAL

## 2020-04-11 MED ORDER — LORAZEPAM 2 MG/ML IJ SOLN
1.0000 mg | INTRAMUSCULAR | Status: DC | PRN
  Administered 2020-04-13: 2 mg via INTRAVENOUS

## 2020-04-11 MED ORDER — METHYLPREDNISOLONE SODIUM SUCC 125 MG IJ SOLR
125.0000 mg | Freq: Once | INTRAMUSCULAR | Status: AC
  Administered 2020-04-12: 125 mg via INTRAVENOUS
  Filled 2020-04-11: qty 2

## 2020-04-11 MED ORDER — ALBUTEROL SULFATE HFA 108 (90 BASE) MCG/ACT IN AERS
2.0000 | INHALATION_SPRAY | Freq: Once | RESPIRATORY_TRACT | Status: AC
  Administered 2020-04-12: 2 via RESPIRATORY_TRACT

## 2020-04-11 MED ORDER — FOLIC ACID 1 MG PO TABS
1.0000 mg | ORAL_TABLET | Freq: Every day | ORAL | Status: DC
  Administered 2020-04-12 – 2020-04-14 (×3): 1 mg via ORAL
  Filled 2020-04-11 (×3): qty 1

## 2020-04-11 MED ORDER — THIAMINE HCL 100 MG PO TABS
100.0000 mg | ORAL_TABLET | Freq: Every day | ORAL | Status: DC
  Administered 2020-04-12 – 2020-04-14 (×3): 100 mg via ORAL
  Filled 2020-04-11 (×3): qty 1

## 2020-04-11 MED ORDER — LORAZEPAM 2 MG/ML IJ SOLN
0.0000 mg | Freq: Two times a day (BID) | INTRAMUSCULAR | Status: DC
  Administered 2020-04-14: 1 mg via INTRAVENOUS
  Filled 2020-04-11: qty 1

## 2020-04-11 MED ORDER — SODIUM CHLORIDE 0.9 % IV BOLUS
1000.0000 mL | Freq: Once | INTRAVENOUS | Status: AC
  Administered 2020-04-12: 1000 mL via INTRAVENOUS

## 2020-04-11 MED ORDER — ADULT MULTIVITAMIN W/MINERALS CH
1.0000 | ORAL_TABLET | Freq: Every day | ORAL | Status: DC
  Administered 2020-04-12 – 2020-04-14 (×3): 1 via ORAL
  Filled 2020-04-11 (×3): qty 1

## 2020-04-11 MED ORDER — ALBUTEROL SULFATE HFA 108 (90 BASE) MCG/ACT IN AERS
4.0000 | INHALATION_SPRAY | Freq: Once | RESPIRATORY_TRACT | Status: AC
  Administered 2020-04-11: 4 via RESPIRATORY_TRACT
  Filled 2020-04-11: qty 6.7

## 2020-04-11 MED ORDER — LORAZEPAM 1 MG PO TABS
1.0000 mg | ORAL_TABLET | ORAL | Status: DC | PRN
  Administered 2020-04-12: 2 mg via ORAL
  Administered 2020-04-12 (×2): 1 mg via ORAL
  Administered 2020-04-13 (×2): 2 mg via ORAL
  Administered 2020-04-14: 1 mg via ORAL
  Filled 2020-04-11: qty 1
  Filled 2020-04-11 (×2): qty 2
  Filled 2020-04-11 (×2): qty 1
  Filled 2020-04-11: qty 2
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
## 2020-04-11 NOTE — ED Notes (Signed)
Verbal order received for patient to take one tablet of 15mg  Oxycodone of his own medicine currently in his possession. Patient provided ginger ale, medication notated in MAR. Will reassess pain.

## 2020-04-11 NOTE — ED Notes (Signed)
Report received from Pablo Pena, [place]. Rounded on patient, registration currently in the room, will round again once registration is finished.

## 2020-04-11 NOTE — ED Notes (Signed)
Rounded on pt, patient back from CT, placed back on monitor. O2 sats noted to be in the upper to mid 80s, patient placed on 3LPM [place], oxygen saturation currently in the mid to upper 90s. Patient states he is in pain, will notify MD for orders.
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
## 2020-04-12 ENCOUNTER — Observation Stay (HOSPITAL_COMMUNITY): Payer: Medicaid Other

## 2020-04-12 DIAGNOSIS — F17200 Nicotine dependence, unspecified, uncomplicated: Secondary | ICD-10-CM | POA: Diagnosis present

## 2020-04-12 DIAGNOSIS — I2510 Atherosclerotic heart disease of native coronary artery without angina pectoris: Secondary | ICD-10-CM | POA: Diagnosis present

## 2020-04-12 DIAGNOSIS — Z20822 Contact with and (suspected) exposure to covid-19: Secondary | ICD-10-CM | POA: Diagnosis present

## 2020-04-12 DIAGNOSIS — E785 Hyperlipidemia, unspecified: Secondary | ICD-10-CM | POA: Diagnosis present

## 2020-04-12 DIAGNOSIS — Z923 Personal history of irradiation: Secondary | ICD-10-CM | POA: Diagnosis not present

## 2020-04-12 DIAGNOSIS — Z79899 Other long term (current) drug therapy: Secondary | ICD-10-CM | POA: Diagnosis not present

## 2020-04-12 DIAGNOSIS — S02401A Maxillary fracture, unspecified, initial encounter for closed fracture: Secondary | ICD-10-CM | POA: Diagnosis present

## 2020-04-12 DIAGNOSIS — J9611 Chronic respiratory failure with hypoxia: Secondary | ICD-10-CM | POA: Diagnosis present

## 2020-04-12 DIAGNOSIS — W19XXXA Unspecified fall, initial encounter: Secondary | ICD-10-CM | POA: Diagnosis present

## 2020-04-12 DIAGNOSIS — J441 Chronic obstructive pulmonary disease with (acute) exacerbation: Secondary | ICD-10-CM | POA: Diagnosis not present

## 2020-04-12 DIAGNOSIS — C3490 Malignant neoplasm of unspecified part of unspecified bronchus or lung: Secondary | ICD-10-CM | POA: Diagnosis present

## 2020-04-12 DIAGNOSIS — Z8674 Personal history of sudden cardiac arrest: Secondary | ICD-10-CM | POA: Diagnosis not present

## 2020-04-12 DIAGNOSIS — I10 Essential (primary) hypertension: Secondary | ICD-10-CM | POA: Diagnosis present

## 2020-04-12 DIAGNOSIS — E119 Type 2 diabetes mellitus without complications: Secondary | ICD-10-CM | POA: Diagnosis present

## 2020-04-12 DIAGNOSIS — Z9221 Personal history of antineoplastic chemotherapy: Secondary | ICD-10-CM | POA: Diagnosis not present

## 2020-04-12 DIAGNOSIS — M797 Fibromyalgia: Secondary | ICD-10-CM | POA: Diagnosis present

## 2020-04-12 DIAGNOSIS — G8929 Other chronic pain: Secondary | ICD-10-CM | POA: Diagnosis present

## 2020-04-12 DIAGNOSIS — Z9181 History of falling: Secondary | ICD-10-CM | POA: Diagnosis not present

## 2020-04-12 DIAGNOSIS — Z7982 Long term (current) use of aspirin: Secondary | ICD-10-CM | POA: Diagnosis not present

## 2020-04-12 DIAGNOSIS — F10229 Alcohol dependence with intoxication, unspecified: Secondary | ICD-10-CM | POA: Diagnosis present

## 2020-04-12 DIAGNOSIS — F418 Other specified anxiety disorders: Secondary | ICD-10-CM | POA: Diagnosis present

## 2020-04-12 DIAGNOSIS — Z85118 Personal history of other malignant neoplasm of bronchus and lung: Secondary | ICD-10-CM | POA: Diagnosis not present

## 2020-04-12 DIAGNOSIS — Z8616 Personal history of COVID-19: Secondary | ICD-10-CM | POA: Diagnosis not present

## 2020-04-12 DIAGNOSIS — F10239 Alcohol dependence with withdrawal, unspecified: Secondary | ICD-10-CM | POA: Diagnosis present

## 2020-04-12 DIAGNOSIS — S022XXA Fracture of nasal bones, initial encounter for closed fracture: Secondary | ICD-10-CM | POA: Diagnosis present

## 2020-04-12 DIAGNOSIS — Z888 Allergy status to other drugs, medicaments and biological substances status: Secondary | ICD-10-CM | POA: Diagnosis not present

## 2020-04-12 DIAGNOSIS — J449 Chronic obstructive pulmonary disease, unspecified: Secondary | ICD-10-CM | POA: Diagnosis present

## 2020-04-12 LAB — CBC
HCT: 39.2 % (ref 39.0–52.0)
Hemoglobin: 13.3 g/dL (ref 13.0–17.0)
MCH: 31.7 pg (ref 26.0–34.0)
MCHC: 33.9 g/dL (ref 30.0–36.0)
MCV: 93.6 fL (ref 80.0–100.0)
Platelets: 243 10*3/uL (ref 150–400)
RBC: 4.19 MIL/uL — ABNORMAL LOW (ref 4.22–5.81)
RDW: 15.9 % — ABNORMAL HIGH (ref 11.5–15.5)
WBC: 6.9 10*3/uL (ref 4.0–10.5)
nRBC: 0 % (ref 0.0–0.2)

## 2020-04-12 LAB — COMPREHENSIVE METABOLIC PANEL WITH GFR
ALT: 77 U/L — ABNORMAL HIGH (ref 0–44)
AST: 99 U/L — ABNORMAL HIGH (ref 15–41)
Albumin: 3.9 g/dL (ref 3.5–5.0)
Alkaline Phosphatase: 185 U/L — ABNORMAL HIGH (ref 38–126)
Anion gap: 16 — ABNORMAL HIGH (ref 5–15)
BUN: 10 mg/dL (ref 6–20)
CO2: 24 mmol/L (ref 22–32)
Calcium: 8.3 mg/dL — ABNORMAL LOW (ref 8.9–10.3)
Chloride: 99 mmol/L (ref 98–111)
Creatinine, Ser: 0.87 mg/dL (ref 0.61–1.24)
GFR calc Af Amer: >60 mL/min
GFR calc non Af Amer: >60 mL/min
Glucose, Bld: 122 mg/dL — ABNORMAL HIGH (ref 70–99)
Potassium: 3.9 mmol/L (ref 3.5–5.1)
Sodium: 139 mmol/L (ref 135–145)
Total Bilirubin: 0.6 mg/dL (ref 0.3–1.2)
Total Protein: 7.9 g/dL (ref 6.5–8.1)

## 2020-04-12 LAB — SARS CORONAVIRUS 2 BY RT PCR (HOSPITAL ORDER, PERFORMED IN ~~LOC~~ HOSPITAL LAB): SARS Coronavirus 2: NEGATIVE

## 2020-04-12 LAB — PHOSPHORUS: Phosphorus: 3.5 mg/dL (ref 2.5–4.6)

## 2020-04-12 LAB — MAGNESIUM: Magnesium: 1.6 mg/dL — ABNORMAL LOW (ref 1.7–2.4)

## 2020-04-12 MED ORDER — HYDROCHLOROTHIAZIDE 12.5 MG PO CAPS
12.5000 mg | ORAL_CAPSULE | Freq: Every day | ORAL | Status: DC
  Administered 2020-04-12 – 2020-04-14 (×3): 12.5 mg via ORAL
  Filled 2020-04-12 (×3): qty 1

## 2020-04-12 MED ORDER — ACETAMINOPHEN 650 MG RE SUPP: 650.0000 mg | Freq: Four times a day (QID) | RECTAL | Status: DC | PRN

## 2020-04-12 MED ORDER — LABETALOL HCL 5 MG/ML IV SOLN: 10.0000 mg | INTRAVENOUS | Status: DC | PRN

## 2020-04-12 MED ORDER — METHOCARBAMOL 500 MG PO TABS: 500.0000 mg | ORAL_TABLET | Freq: Two times a day (BID) | ORAL | Status: DC

## 2020-04-12 MED ORDER — DOXYCYCLINE HYCLATE 100 MG PO TABS
100.0000 mg | ORAL_TABLET | Freq: Two times a day (BID) | ORAL | Status: DC
  Administered 2020-04-12 – 2020-04-14 (×5): 100 mg via ORAL
  Filled 2020-04-12 (×5): qty 1

## 2020-04-12 MED ORDER — AMLODIPINE BESYLATE 10 MG PO TABS
10.0000 mg | ORAL_TABLET | Freq: Every day | ORAL | Status: DC
  Administered 2020-04-12 – 2020-04-14 (×3): 10 mg via ORAL
  Filled 2020-04-12 (×3): qty 1

## 2020-04-12 MED ORDER — SODIUM CHLORIDE 0.9% FLUSH
3.0000 mL | Freq: Two times a day (BID) | INTRAVENOUS | Status: DC
  Administered 2020-04-12 (×2): 3 mL via INTRAVENOUS

## 2020-04-12 MED ORDER — ACETAMINOPHEN 325 MG PO TABS: 650.0000 mg | ORAL_TABLET | Freq: Four times a day (QID) | ORAL | Status: DC | PRN

## 2020-04-12 MED ORDER — ASPIRIN EC 81 MG PO TBEC
81.0000 mg | DELAYED_RELEASE_TABLET | Freq: Every day | ORAL | Status: DC
  Administered 2020-04-12 – 2020-04-14 (×3): 81 mg via ORAL
  Filled 2020-04-12 (×3): qty 1

## 2020-04-12 MED ORDER — MOMETASONE FURO-FORMOTEROL FUM 200-5 MCG/ACT IN AERO
2.0000 | INHALATION_SPRAY | Freq: Two times a day (BID) | RESPIRATORY_TRACT | Status: DC
  Administered 2020-04-12 – 2020-04-14 (×5): 2 via RESPIRATORY_TRACT
  Filled 2020-04-12: qty 8.8

## 2020-04-12 MED ORDER — LOSARTAN POTASSIUM 25 MG PO TABS
25.0000 mg | ORAL_TABLET | Freq: Every day | ORAL | Status: DC
  Administered 2020-04-12 – 2020-04-14 (×3): 25 mg via ORAL
  Filled 2020-04-12 (×3): qty 1

## 2020-04-12 MED ORDER — ATORVASTATIN CALCIUM 40 MG PO TABS
80.0000 mg | ORAL_TABLET | Freq: Every day | ORAL | Status: DC
  Administered 2020-04-12 – 2020-04-14 (×3): 80 mg via ORAL
  Filled 2020-04-12 (×3): qty 2

## 2020-04-12 MED ORDER — ALBUTEROL SULFATE HFA 108 (90 BASE) MCG/ACT IN AERS: 2.0000 | INHALATION_SPRAY | RESPIRATORY_TRACT | Status: DC | PRN

## 2020-04-12 MED ORDER — CHLORHEXIDINE GLUCONATE CLOTH 2 % EX PADS
6.0000 | MEDICATED_PAD | Freq: Every day | CUTANEOUS | Status: DC
  Administered 2020-04-13 – 2020-04-14 (×2): 6 via TOPICAL

## 2020-04-12 MED ORDER — ALBUTEROL SULFATE (2.5 MG/3ML) 0.083% IN NEBU: 2.5000 mg | INHALATION_SOLUTION | RESPIRATORY_TRACT | Status: DC | PRN

## 2020-04-12 MED ORDER — PROMETHAZINE HCL 25 MG PO TABS: 12.5000 mg | ORAL_TABLET | Freq: Four times a day (QID) | ORAL | Status: DC | PRN

## 2020-04-12 MED ORDER — PANTOPRAZOLE SODIUM 40 MG PO TBEC
40.0000 mg | DELAYED_RELEASE_TABLET | Freq: Every day | ORAL | Status: DC
  Administered 2020-04-12 – 2020-04-14 (×3): 40 mg via ORAL
  Filled 2020-04-12 (×3): qty 1

## 2020-04-12 MED ORDER — UMECLIDINIUM BROMIDE 62.5 MCG/INH IN AEPB
1.0000 | INHALATION_SPRAY | Freq: Every day | RESPIRATORY_TRACT | Status: DC
  Administered 2020-04-12 – 2020-04-14 (×2): 1 via RESPIRATORY_TRACT
  Filled 2020-04-12: qty 7

## 2020-04-12 MED ORDER — HYDROXYZINE HCL 25 MG PO TABS
25.0000 mg | ORAL_TABLET | Freq: Three times a day (TID) | ORAL | Status: DC | PRN
  Administered 2020-04-14: 25 mg via ORAL
  Filled 2020-04-12: qty 1

## 2020-04-12 MED ORDER — OXYCODONE HCL 5 MG PO TABS
15.0000 mg | ORAL_TABLET | Freq: Four times a day (QID) | ORAL | Status: DC | PRN
  Administered 2020-04-12 – 2020-04-13 (×5): 15 mg via ORAL
  Filled 2020-04-12 (×5): qty 3

## 2020-04-12 MED ORDER — CYCLOBENZAPRINE HCL 10 MG PO TABS: 10.0000 mg | ORAL_TABLET | Freq: Every evening | ORAL | Status: DC | PRN

## 2020-04-12 MED ORDER — ALUM & MAG HYDROXIDE-SIMETH 200-200-20 MG/5ML PO SUSP
15.0000 mL | Freq: Four times a day (QID) | ORAL | Status: DC | PRN
  Administered 2020-04-12: 15 mL via ORAL
  Filled 2020-04-12: qty 30

## 2020-04-12 MED ORDER — OXYCODONE HCL 5 MG PO TABS
15.0000 mg | ORAL_TABLET | Freq: Four times a day (QID) | ORAL | Status: DC
  Administered 2020-04-12 – 2020-04-14 (×6): 15 mg via ORAL
  Filled 2020-04-12 (×6): qty 3

## 2020-04-12 MED ORDER — SODIUM CHLORIDE 0.9 % IV SOLN: INTRAVENOUS | Status: DC

## 2020-04-12 MED ORDER — ESCITALOPRAM OXALATE 10 MG PO TABS
10.0000 mg | ORAL_TABLET | Freq: Every day | ORAL | Status: DC
  Administered 2020-04-12 – 2020-04-14 (×3): 10 mg via ORAL
  Filled 2020-04-12 (×3): qty 1

## 2020-04-12 MED ORDER — SENNOSIDES-DOCUSATE SODIUM 8.6-50 MG PO TABS: 1.0000 | ORAL_TABLET | Freq: Every evening | ORAL | Status: DC | PRN

## 2020-04-12 MED ORDER — PREGABALIN 75 MG PO CAPS
75.0000 mg | ORAL_CAPSULE | Freq: Two times a day (BID) | ORAL | Status: DC
  Administered 2020-04-12 – 2020-04-14 (×6): 75 mg via ORAL
  Filled 2020-04-12 (×6): qty 1

## 2020-04-12 MED ORDER — PREDNISONE 20 MG PO TABS
40.0000 mg | ORAL_TABLET | Freq: Every day | ORAL | Status: DC
  Administered 2020-04-13 – 2020-04-14 (×2): 40 mg via ORAL
  Filled 2020-04-12 (×2): qty 2

## 2020-04-12 MED ORDER — MAGNESIUM SULFATE 2 GM/50ML IV SOLN
2.0000 g | Freq: Once | INTRAVENOUS | Status: AC
  Administered 2020-04-12: 2 g via INTRAVENOUS
  Filled 2020-04-12: qty 50

## 2020-04-12 MED ORDER — METHYLPREDNISOLONE SODIUM SUCC 40 MG IJ SOLR
40.0000 mg | Freq: Three times a day (TID) | INTRAMUSCULAR | Status: DC
  Administered 2020-04-12: 40 mg via INTRAVENOUS
  Filled 2020-04-12: qty 1

## 2020-04-12 MED ORDER — BISACODYL 5 MG PO TBEC: 5.0000 mg | DELAYED_RELEASE_TABLET | Freq: Every day | ORAL | Status: DC | PRN

## 2020-04-12 NOTE — Plan of Care (Signed)
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
## 2020-04-13 LAB — CBC WITH DIFFERENTIAL/PLATELET
Abs Immature Granulocytes: 0.03 10*3/uL (ref 0.00–0.07)
Basophils Absolute: 0 10*3/uL (ref 0.0–0.1)
Basophils Relative: 0 %
Eosinophils Absolute: 0 10*3/uL (ref 0.0–0.5)
Eosinophils Relative: 0 %
HCT: 36.2 % — ABNORMAL LOW (ref 39.0–52.0)
Hemoglobin: 12.2 g/dL — ABNORMAL LOW (ref 13.0–17.0)
Immature Granulocytes: 0 %
Lymphocytes Relative: 4 %
Lymphs Abs: 0.4 10*3/uL — ABNORMAL LOW (ref 0.7–4.0)
MCH: 31.8 pg (ref 26.0–34.0)
MCHC: 33.7 g/dL (ref 30.0–36.0)
MCV: 94.3 fL (ref 80.0–100.0)
Monocytes Absolute: 1 10*3/uL (ref 0.1–1.0)
Monocytes Relative: 11 %
Neutro Abs: 8.2 10*3/uL — ABNORMAL HIGH (ref 1.7–7.7)
Neutrophils Relative %: 85 %
Platelets: 182 10*3/uL (ref 150–400)
RBC: 3.84 MIL/uL — ABNORMAL LOW (ref 4.22–5.81)
RDW: 15.8 % — ABNORMAL HIGH (ref 11.5–15.5)
WBC: 9.7 10*3/uL (ref 4.0–10.5)
nRBC: 0 % (ref 0.0–0.2)

## 2020-04-13 LAB — BASIC METABOLIC PANEL
Anion gap: 13 (ref 5–15)
BUN: 15 mg/dL (ref 6–20)
CO2: 26 mmol/L (ref 22–32)
Calcium: 9.2 mg/dL (ref 8.9–10.3)
Chloride: 96 mmol/L — ABNORMAL LOW (ref 98–111)
Creatinine, Ser: 0.94 mg/dL (ref 0.61–1.24)
GFR calc Af Amer: >60 mL/min (ref >60)
GFR calc non Af Amer: >60 mL/min (ref >60)
Glucose, Bld: 160 mg/dL — ABNORMAL HIGH (ref 70–99)
Potassium: 3.8 mmol/L (ref 3.5–5.1)
Sodium: 135 mmol/L (ref 135–145)

## 2020-04-13 LAB — HIV ANTIBODY (ROUTINE TESTING W REFLEX): HIV Screen 4th Generation wRfx: NONREACTIVE

## 2020-04-13 MED ORDER — SODIUM CHLORIDE 0.9% FLUSH
10.0000 mL | Freq: Two times a day (BID) | INTRAVENOUS | Status: DC
  Administered 2020-04-13: 10 mL

## 2020-04-13 MED ORDER — CHLORDIAZEPOXIDE HCL 5 MG PO CAPS
15.0000 mg | ORAL_CAPSULE | Freq: Once | ORAL | Status: AC
  Administered 2020-04-13: 15 mg via ORAL

## 2020-04-13 MED ORDER — SODIUM CHLORIDE 0.9% FLUSH: 10.0000 mL | INTRAVENOUS | Status: DC | PRN

## 2020-04-13 NOTE — Progress Notes (Signed)
Patient complained of generalized pain worsening lt hip 10/10. Last Oxycodone shortly before 4 am. Provider contacted to assist in pain management. CIWA (12) discussed. V.O received to administer Oxy IR/Roxidone 15 mg now and Librium 15 mg once now. Will continue to monitor

## 2020-04-13 NOTE — Progress Notes (Signed)
Patient complained of difficulty swallowing po medications. Applesauce provided and pt was encouraged to tuck chin to assist. Pt reports these strategies working well to assist with swallowing medications. Will update provider and continue to monitor.

## 2020-04-13 NOTE — Progress Notes (Signed)
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 Verner Chol [facility] Situational anxiety on Xanax for the same Known chronic alcohol dependence Still smoker-prior cocaine use LHC 2007 normal Chronic pain Recent coronavirus infection Prior PACs PVCs Hyperlipidemia He was apparently hospitalized at [facility] March 2021?  Heart attack?  Collapse details unclear no records available Dr. Burt Knack saw the patient in the office 01/27/2020 and Carlton Adam was performed 02/19/2020 which was negative for high risk features  Triage reports state found down small laceration right eye 86% room air improved to 96% on 3 L ED work-up = CT head CT spine negative maxillofacial CT remote fractures mildly displaced nasal bone fracture Rx albuterol, Solu-Medrol Assessment & Plan:   Principal Problem:   COPD with acute exacerbation (HCC) Active Problems:   Essential hypertension   Mild CAD   Alcohol abuse   Nasal bone fractures   Small cell lung cancer (HCC)   Chronic pain   COPD exacerbation ([place])   1. Mild COPD exacerbation a. On oral prednisone b. Continue albuterol nebs every 4 as needed, Dulera 2 puffs twice daily and Incruse Ellipta c. ? Affordable- LABA/LAMA combo and will ask case management to help 2. Alcohol abuse disorder has been drinking 1/5 to 1/2 gallon of vodka"For years"  a. CIWA ~12--added low dose Libriukm 7/5 amwith ativan b. We will replace his magnesium with 2 g IV today-I would not chase phosphorus mag and these other labs as I doubt he will quit drinking 3. Nasal bone fractures a. Multiple falls in the past b.  living in a hotel because he lost his housing--might go home with brother? c. TOC to comment 4. SCLC completing  chemo/radiation a. Will need follow-up with his [facility] oncologist and radiation oncologist 5. ?  Impaired glucose tolerance a. Blood sugar 122 no formal diagnosis of diabetes-monitor only 6. Nonobstructive CAD 7. HTN  a. Uncontrolled HTN b. Continue amlodipine, add HCTZ 12.5 and recheck labs a.m. 8. Tobacco abuse 9. Chronic pain 10. Prior polysubstance abuse  DVT prophylaxis: scd Code Status: full Family Communication:talke with brother today  Disposition: likely home am if has withdrawn and less tremulous  Status is: Observation The patient will require care spanning > 2 midnights and should be moved to inpatient because: Ongoing active pain requiring inpatient pain management, Unsafe d/c plan and IV treatments appropriate due to intensity of illness or inability to take PO  Dispo: The patient is from: Unclear where patient will discharge unsafe discharge              Anticipated d/c is to: See above discussion              Anticipated d/c date is: 2 days              Patient currently is not medically stable to d/c. Consultants:   None  Procedures: None  Antimicrobials: None  Subjective:  Somewhat tremulous today no cp no fever Wheezing less  Objective: Vitals:   04/13/20 0801 04/13/20 0838 04/13/20 1305 04/13/20 1722  BP:  (!) 152/80 (!) 164/77 (!) 141/78  Pulse:  64 68 75  Resp:  18 18 16   Temp:  98.6 F (37 C) 98.6 F (37 C) 98.2 F (36.8 C)  TempSrc:  Oral Oral Oral  SpO2: 95% 94% 92% 92%  Weight:      Height:        Intake/Output Summary (Last 24 hours) at 04/13/2020 1722 Last data filed at 04/12/2020 2228 Gross per 24 hour  Intake 3 ml  Output --  Net 3 ml   Filed Weights   04/12/20 0201  Weight: 67.7 kg    Examination:  coherent bruising over nose General exam: Slightly tremulous Respiratory system: wheeze improved--no rales Cardiovascular system: S1-S2 no murmur rub or gallop Gastrointestinal system: Soft nontender no rebound. Central  nervous system: Grossly intact moving all 4 limbs equally Extremities: Soft Skin: Intact bruising to face with nasal bridge fracture Psychiatry: Euthymic anxious slight tremulous  Data Reviewed: I have personally reviewed following labs and imaging studies Sodium 135 BUN/creatinine 10/0.8-->15/0.9 Anion gap 13  Radiology Studies: CT Head Wo Contrast  Result Date: 04/11/2020 CLINICAL DATA:  Facial trauma Head trauma, headache EXAM: CT HEAD WITHOUT CONTRAST TECHNIQUE: Contiguous axial images were obtained from the base of the skull through the vertex without intravenous contrast. COMPARISON:  Head CT 4 days ago 04/07/2020. FINDINGS: Brain: Stable degree of atrophy. No intracranial hemorrhage, mass effect, or midline shift. No hydrocephalus. The basilar cisterns are patent. No evidence of territorial infarct or acute ischemia. No extra-axial or intracranial fluid collection. Vascular: Insert [place] Skull: No fracture or focal lesion. Sinuses/Orbits: Assessed on concurrent face CT, reported separately. Other: None. IMPRESSION: 1. No acute intracranial abnormality. No skull fracture. 2. Stable atrophy. Electronically Signed   By: Keith Rake M.D.   On: 04/11/2020 19:55   CT Cervical Spine Wo Contrast  Result Date: 04/11/2020 CLINICAL DATA:  Cannot find appropriate structured reason for exam - see additional comments neck pain after fall, alcohol abuse EXAM: CT CERVICAL SPINE WITHOUT CONTRAST TECHNIQUE: Multidetector CT imaging of the cervical spine was performed without intravenous contrast. Multiplanar CT image reconstructions were also generated. COMPARISON:  CT [id number] FINDINGS: Alignment: Stable chronic minimal anterolisthesis of C4 on C5 which is likely did degenerative. Skull base and vertebrae: No acute fracture. Vertebral body heights are maintained. The dens and skull base are intact. Soft tissues and spinal canal: No prevertebral fluid or swelling. No visible canal hematoma. Disc levels:  Multilevel degenerative disc disease most prominent at C4-C5. Multilevel facet hypertrophy. Upper chest: Right chest port partially included.  Emphysema. Other: Carotid calcifications. IMPRESSION: 1. No acute fracture or subluxation of the cervical spine. 2. Multilevel degenerative disc disease and facet hypertrophy. Electronically Signed   By: Keith Rake M.D.   On: 04/11/2020 20:04   DG CHEST PORT 1 VIEW  Result Date: 04/12/2020 CLINICAL DATA:  COPD. EXAM: PORTABLE CHEST 1 VIEW COMPARISON:  Radiograph 04/07/2020.  CT 03/03/2020 FINDINGS: Right chest port with tip in the SVC. Normal heart size with unchanged mediastinal contours. The right-sided pulmonary nodule on prior CT is not well demonstrated by radiograph. Emphysema with chronic bronchial thickening. No pneumothorax, pleural effusion, or superimposed pulmonary edema. Remote bilateral rib fractures. Postsurgical change of the distal clavicles. No acute osseous abnormalities are seen. IMPRESSION: 1. No acute abnormality. 2. Emphysema and chronic bronchial thickening. Electronically Signed   By: Keith Rake M.D.   On: 04/12/2020 00:10   CT Maxillofacial WO CM  Result Date: 04/11/2020 CLINICAL DATA:  Facial trauma Nose bleed.  Laceration to left eye. EXAM: CT MAXILLOFACIAL WITHOUT CONTRAST TECHNIQUE: Multidetector CT imaging of the maxillofacial structures was performed. Multiplanar CT image reconstructions were also generated. COMPARISON:  No  dedicated face CT. Included portion from head CT 04/07/2020 reviewed. FINDINGS: Osseous: Acute on chronic right and acute left nasal bone fractures, mildly displaced. Portions of the nasal septum are diminutive. Remote right side traumatic arch fracture. No mandibular fracture. The temporomandibular joints are congruent. The patient is edentulous. Orbits: Remote right medial and inferior orbital wall fractures. No evidence of acute orbital fracture. No globe injury. Sinuses: No sinus fracture or hemosinus.  Mucosal thickening with frothy debris in the right maxillary sinus. The mastoid air cells are clear. Soft tissues: Mild patchy soft tissue edema in the left face. No confluent hematoma or radiopaque foreign body. Carotid calcifications Limited intracranial: Assessed on concurrent head CT, reported separately. IMPRESSION: 1. Acute on chronic right and acute left nasal bone fractures, mildly displaced. 2. Remote right zygomatic fracture, remote right medial and inferior orbital wall fractures. No evidence of acute orbital fracture. Electronically Signed   By: Keith Rake M.D.   On: 04/11/2020 19:59   Scheduled Meds:  amLODipine  10 mg Oral Daily   aspirin EC  81 mg Oral Daily   atorvastatin  80 mg Oral Daily   Chlorhexidine Gluconate Cloth  6 each Topical Daily   doxycycline  100 mg Oral Q12H   escitalopram  10 mg Oral Daily   folic acid  1 mg Oral Daily   hydrochlorothiazide  12.5 mg Oral Daily   LORazepam  0-4 mg Intravenous Q6H   Followed by   Derrill Memo ON 04/14/2020] LORazepam  0-4 mg Intravenous Q12H   losartan  25 mg Oral Daily   mometasone-formoterol  2 puff Inhalation BID   multivitamin with minerals  1 tablet Oral Daily   oxyCODONE  15 mg Oral Q6H   pantoprazole  40 mg Oral Daily   predniSONE  40 mg Oral QAC breakfast   pregabalin  75 mg Oral BID   sodium chloride flush  10-40 mL Intracatheter Q12H   sodium chloride flush  3 mL Intravenous Q12H   thiamine  100 mg Oral Daily   Or   thiamine  100 mg Intravenous Daily   umeclidinium bromide  1 puff Inhalation Daily   Continuous Infusions:   LOS: 1 day   Time spent: 25  Nita Sells, MD [facility] To contact the attending provider between 7A-7P or the covering provider during after hours 7P-7A, please log into the web site [email or web address] and access using universal [place] password for that web site. If you do not have the password, please call the hospital operator.  04/13/2020, 5:22 PM

## 2020-04-14 MED ORDER — HYDROCHLOROTHIAZIDE 12.5 MG PO CAPS: 12.5000 mg | ORAL_CAPSULE | Freq: Every day | ORAL | 1 refills | Status: AC

## 2020-04-14 MED ORDER — PREDNISONE 20 MG PO TABS: 40.0000 mg | ORAL_TABLET | Freq: Every day | ORAL | 0 refills | Status: AC

## 2020-04-14 MED ORDER — HEPARIN SOD (PORK) LOCK FLUSH 100 UNIT/ML IV SOLN
500.0000 [IU] | INTRAVENOUS | Status: AC | PRN
  Administered 2020-04-14: 500 [IU]
  Filled 2020-04-14: qty 5

## 2020-04-14 NOTE — TOC Transition Note (Signed)
Transition of Care [facility]) - CM/SW Discharge Note   Patient Details  Name: Wesley Howell MRN: [id number] Date of Birth: 1966-07-25  [facility] CM/SW Contact:  Shade Flood, LCSW Phone Number: 04/14/2020, 10:43 AM   Clinical Narrative:     Received TOC consult to see pt for multiple psychosocial concerns.  Met with pt today to assess. Pt reports that he has been staying at a motel prior to admission. Pt states that he is going to go to stay with his brother at dc. Pt states that his fiance is going to pick him up and take him there. Per pt, he feels he is walking well and does not need any DME for dc.  Discussed pt's AODA concerns and offered resources for treatment. Pt stated that he didn't need the resources and that he has quit on his own in the past and states "abstinence is what works best for me." Pt states that his brother does not drink and that as long as he is not around the wrong people he believes he will be able to abstain.  Pt did request housing resources and these were provided to him.  Updated MD. There are no other TOC needs identified for dc.   Expected Discharge Plan: Home/Self Care Barriers to Discharge: Barriers Resolved   Patient Goals and CMS Choice        Expected Discharge Plan and Services Expected Discharge Plan: Home/Self Care In-house Referral: Clinical Social Work     Living arrangements for the past 2 months: Hotel/Motel Expected Discharge Date: 04/14/20                                    Prior Living Arrangements/Services Living arrangements for the past 2 months: Hotel/Motel Lives with:: Self Patient language and need for interpreter reviewed:: Yes Do you feel safe going back to the place where you live?: Yes      Need for Family Participation in Patient Care: No (Comment) Care giver support system in place?: Yes (comment)   Criminal Activity/Legal Involvement Pertinent to Current Situation/Hospitalization: No  - Comment as needed  Activities of Daily Living Home Assistive Devices/Equipment: None ADL Screening (condition at time of admission) Patient's cognitive ability adequate to safely complete daily activities?: Yes Is the patient deaf or have difficulty hearing?: No Does the patient have difficulty seeing, even when wearing glasses/contacts?: No Does the patient have difficulty concentrating, remembering, or making decisions?: No Patient able to express need for assistance with ADLs?: Yes Does the patient have difficulty dressing or bathing?: No Independently performs ADLs?: Yes (appropriate for developmental age) Does the patient have difficulty walking or climbing stairs?: No Weakness of Legs: None Weakness of Arms/Hands: None  Permission Sought/Granted                  Emotional Assessment Appearance:: Appears stated age Attitude/Demeanor/Rapport: Engaged Affect (typically observed): Pleasant Orientation: : Oriented to Self, Oriented to Place, Oriented to  Time, Oriented to Situation Alcohol / Substance Use: Alcohol Use Psych Involvement: No (comment)  Admission diagnosis:  COPD (chronic obstructive pulmonary disease) ([place]) [J44.9] COPD with acute exacerbation ([place]) [J44.1] COPD exacerbation ([place]) [J44.1] Patient Active Problem List   Diagnosis Date Noted  . Small cell lung cancer ([place]) 04/12/2020  . Chronic pain 04/12/2020  . COPD exacerbation ([place]) 04/12/2020  . COPD with acute exacerbation ([place]) 04/11/2020  . Alcohol abuse 04/11/2020  .  Nasal bone fractures 04/11/2020  . Palpitations 09/15/2016  . Tobacco abuse 07/05/2016  . Mild CAD   . DOE (dyspnea on exertion) 08/29/2013  . Essential hypertension 08/29/2013  . COPD (chronic obstructive pulmonary disease) ([place]) 08/29/2013  . Hyperlipidemia 08/29/2013   PCP:  Bonnita Nasuti, MD Pharmacy:   [place], [place] [address] Phone: [phone] Fax:  [place], [place] - [address] [place] [place] [address] Phone: [phone] Fax: [place], [place] [address] Phone: [phone] Fax: [phone]     Social Determinants of Health (SDOH) Interventions    Readmission Risk Interventions Readmission Risk Prevention Plan 04/14/2020  Transportation Screening Complete  Medication Review (RN CM) Complete  Some recent data might be hidden     Final next level of care: Home/Self Care Barriers to Discharge: Barriers Resolved   Patient Goals and CMS Choice        Discharge Placement                       Discharge Plan and Services In-house Referral: Clinical Social Work                                   Social Determinants of Health (SDOH) Interventions     Readmission Risk Interventions Readmission Risk Prevention Plan 04/14/2020  Transportation Screening Complete  Medication Review (RN CM) Complete  Some recent data might be hidden

## 2020-04-14 NOTE — Discharge Summary (Signed)
Physician Discharge Summary  CAILAN GENERAL RXV:[id number] DOB: [birth year] DOA: 04/11/2020  PCP: Bonnita Nasuti, MD  Admit date: 04/11/2020 Discharge date: 04/14/2020  Time spent: 20 minutes  Recommendations for Outpatient Follow-up:  1. Needs cessation counseling if he is willing as an outpatient 2. Burst of steroids given on discharge in addition to augmentation of his antihypertensive regimen added HCTZ 3. Outpatient imaging as per oncology 4. No pain meds or controlled substances were prescribed on discharge 5. Suggest outpatient A1c and testing for diabetes 6.   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 Verner Chol [facility] Situational anxiety on Xanax for the same Known chronic alcohol dependence Still smoker-prior cocaine use LHC 2007 normal Chronic pain Recent coronavirus infection Prior PACs PVCs Hyperlipidemia He was apparently hospitalized at [facility] health March 2021?  Heart attack?  Collapse details unclear no records available Dr. Burt Knack saw the patient in the office 01/27/2020 and Carlton Adam was performed 02/19/2020 which was negative for high risk features  Triage reports state found down small laceration right eye 86% room air improved to 96% on 3 L ED work-up = CT head CT spine negative maxillofacial CT remote fractures mildly displaced nasal bone fracture Rx albuterol, Solu-Medrol  Hospital Course:  1. Mild  COPD exacerbation a. On oral prednisone currently b. Continue albuterol nebs every 4 as needed, Dulera 2 puffs twice daily and Incruse Ellipta 2. Alcohol abuse disorder has been drinking 1/5 to 1/2 gallon of vodka"For years"  a. CIWA  went as high as 12 but came down to 8 b. Because reason was unclear whether he would drink when he leaves the hospital he was not prescribed any other medications for cessation on discharge c. Periodic LFTs magnesium as an outpatient 3. Nasal bone fractures a. Multiple falls in the past b. His wounds healed up pretty well during hospital stay 4. SCLC completing chemo/radiation a. Will need follow-up with his [facility] oncologist and radiation oncologist 5. ?  Impaired glucose tolerance a. Blood sugar 122 no formal diagnosis of diabetes-monitor only 6. Nonobstructive CAD 7. HTN      a. Uncontrolled HTN b. Continue amlodipine, HCTZ 12.5 was added 8. Tobacco abuse 9. Chronic pain 10. Prior polysubstance abuse  Procedures:  None  Consultations:  None  Discharge Exam: Vitals:   04/14/20 0745 04/14/20 0901  BP:  135/88  Pulse:  74  Resp:  18  Temp:  98.2 F (36.8 C)  SpO2: 95% (!) 89%   Doing well awake alert no distress eating and drinking without focal deficit Ambulatory to some degree not shaky  General: Coherent pleasant no distress Cardiovascular: S1-S2 no murmur rub or gallop Respiratory: Clinically clear no added sound no rales no rhonchi Abdomen soft nontender no rebound no guarding  Discharge Instructions   Discharge Instructions    Diet - low sodium heart healthy   Complete by: As directed  Discharge instructions   Complete by: As directed    Make sure that you follow up with your oncologist in the outpatient setting Get labs as per your oncologist Stop smoking and drinking Complete the steroids in the OP setting--you do not require antibiotics for your COPD   Increase activity slowly   Complete by: As directed       Allergies as of 04/14/2020      Reactions   Ace Inhibitors Itching   Pregabalin    "makes me do stuff in my sleep"      Medication List    STOP taking these medications   diphenhydrAMINE 25 mg capsule Commonly known as: BENADRYL     TAKE these medications   albuterol 108 (90 Base) MCG/ACT inhaler Commonly known as: VENTOLIN HFA Inhale 2 puffs into the lungs every 6 (six) hours as needed for wheezing or shortness of breath.   ALPRAZolam 1 MG tablet Commonly known as: XANAX Take 1 tablet by mouth 2 (two) times daily as needed for anxiety.   amLODipine 10 MG tablet Commonly known as: NORVASC Take 1 tablet (10 mg total) by mouth daily.   aspirin EC 81 MG tablet Take 1 tablet (81 mg total) by mouth daily.   atorvastatin 80 MG tablet Commonly known as: LIPITOR Take 1 tablet (80 mg total) by mouth daily.   cyclobenzaprine 10 MG tablet Commonly known as: FLEXERIL Take 10 mg by mouth at bedtime as needed for muscle spasms.   escitalopram 10 MG tablet Commonly known as: LEXAPRO Take 10 mg by mouth daily.   gabapentin 300 MG capsule Commonly known as: NEURONTIN Take 300 mg by mouth daily as needed (pain).   hydrochlorothiazide 12.5 MG capsule Commonly known as: MICROZIDE Take 1 capsule (12.5 mg total) by mouth daily. Start taking on: April 15, 2020   hydrOXYzine 25 MG tablet Commonly known as: ATARAX/VISTARIL Take 25 mg by mouth 2 (two) times daily as needed for anxiety or sleep.   losartan 25 MG tablet Commonly known as: COZAAR Take 1 tablet (25 mg total) by mouth daily.   methocarbamol 500 MG tablet Commonly known as: ROBAXIN Take 500 mg by mouth 2 (two) times daily.   multivitamin tablet Take 1 tablet by mouth daily.   omeprazole 40 MG capsule Commonly known as: PRILOSEC Take 40 mg by mouth daily.   oxyCODONE 15 MG immediate release tablet Commonly known as: ROXICODONE Take 15 mg by mouth every 6 (six) hours.   predniSONE 20 MG tablet Commonly known  as: DELTASONE Take 2 tablets (40 mg total) by mouth daily before breakfast for 3 days. Start taking on: April 15, 2020   pregabalin 75 MG capsule Commonly known as: LYRICA Take 75 mg by mouth 2 (two) times daily.   promethazine 12.5 MG tablet Commonly known as: PHENERGAN Take 12.5 mg by mouth every 6 (six) hours as needed for nausea/vomiting.   rosuvastatin 10 MG tablet Commonly known as: CRESTOR Take 10 mg by mouth daily.   Spiriva Respimat 2.5 MCG/ACT Aers Generic drug: Tiotropium Bromide Monohydrate Inhale 2 puffs into the lungs daily.   Symbicort 160-4.5 MCG/ACT inhaler Generic drug: budesonide-formoterol Inhale 2 puffs into the lungs 2 (two) times daily.      Allergies  Allergen Reactions  . Ace Inhibitors Itching  . Pregabalin     "makes me do stuff in my sleep"      The results of significant diagnostics from this hospitalization (including imaging, microbiology, ancillary and laboratory) are listed  below for reference.    Significant Diagnostic Studies: CT Head Wo Contrast  Result Date: 04/11/2020 CLINICAL DATA:  Facial trauma Head trauma, headache EXAM: CT HEAD WITHOUT CONTRAST TECHNIQUE: Contiguous axial images were obtained from the base of the skull through the vertex without intravenous contrast. COMPARISON:  Head CT 4 days ago 04/07/2020. FINDINGS: Brain: Stable degree of atrophy. No intracranial hemorrhage, mass effect, or midline shift. No hydrocephalus. The basilar cisterns are patent. No evidence of territorial infarct or acute ischemia. No extra-axial or intracranial fluid collection. Vascular: Insert [place] Skull: No fracture or focal lesion. Sinuses/Orbits: Assessed on concurrent face CT, reported separately. Other: None. IMPRESSION: 1. No acute intracranial abnormality. No skull fracture. 2. Stable atrophy. Electronically Signed   By: Keith Rake M.D.   On: 04/11/2020 19:55   CT Cervical Spine Wo Contrast  Result Date: 04/11/2020 CLINICAL DATA:   Cannot find appropriate structured reason for exam - see additional comments neck pain after fall, alcohol abuse EXAM: CT CERVICAL SPINE WITHOUT CONTRAST TECHNIQUE: Multidetector CT imaging of the cervical spine was performed without intravenous contrast. Multiplanar CT image reconstructions were also generated. COMPARISON:  CT [id number] FINDINGS: Alignment: Stable chronic minimal anterolisthesis of C4 on C5 which is likely did degenerative. Skull base and vertebrae: No acute fracture. Vertebral body heights are maintained. The dens and skull base are intact. Soft tissues and spinal canal: No prevertebral fluid or swelling. No visible canal hematoma. Disc levels: Multilevel degenerative disc disease most prominent at C4-C5. Multilevel facet hypertrophy. Upper chest: Right chest port partially included.  Emphysema. Other: Carotid calcifications. IMPRESSION: 1. No acute fracture or subluxation of the cervical spine. 2. Multilevel degenerative disc disease and facet hypertrophy. Electronically Signed   By: Keith Rake M.D.   On: 04/11/2020 20:04   DG CHEST PORT 1 VIEW  Result Date: 04/12/2020 CLINICAL DATA:  COPD. EXAM: PORTABLE CHEST 1 VIEW COMPARISON:  Radiograph 04/07/2020.  CT 03/03/2020 FINDINGS: Right chest port with tip in the SVC. Normal heart size with unchanged mediastinal contours. The right-sided pulmonary nodule on prior CT is not well demonstrated by radiograph. Emphysema with chronic bronchial thickening. No pneumothorax, pleural effusion, or superimposed pulmonary edema. Remote bilateral rib fractures. Postsurgical change of the distal clavicles. No acute osseous abnormalities are seen. IMPRESSION: 1. No acute abnormality. 2. Emphysema and chronic bronchial thickening. Electronically Signed   By: Keith Rake M.D.   On: 04/12/2020 00:10   CT Maxillofacial WO CM  Result Date: 04/11/2020 CLINICAL DATA:  Facial trauma Nose bleed.  Laceration to left eye. EXAM: CT MAXILLOFACIAL WITHOUT CONTRAST  TECHNIQUE: Multidetector CT imaging of the maxillofacial structures was performed. Multiplanar CT image reconstructions were also generated. COMPARISON:  No dedicated face CT. Included portion from head CT 04/07/2020 reviewed. FINDINGS: Osseous: Acute on chronic right and acute left nasal bone fractures, mildly displaced. Portions of the nasal septum are diminutive. Remote right side traumatic arch fracture. No mandibular fracture. The temporomandibular joints are congruent. The patient is edentulous. Orbits: Remote right medial and inferior orbital wall fractures. No evidence of acute orbital fracture. No globe injury. Sinuses: No sinus fracture or hemosinus. Mucosal thickening with frothy debris in the right maxillary sinus. The mastoid air cells are clear. Soft tissues: Mild patchy soft tissue edema in the left face. No confluent hematoma or radiopaque foreign body. Carotid calcifications Limited intracranial: Assessed on concurrent head CT, reported separately. IMPRESSION: 1. Acute on chronic right and acute left nasal bone fractures, mildly displaced. 2. Remote right zygomatic fracture, remote  right medial and inferior orbital wall fractures. No evidence of acute orbital fracture. Electronically Signed   By: Keith Rake M.D.   On: 04/11/2020 19:59    Microbiology: Recent Results (from the past 240 hour(s))  SARS Coronavirus 2 by RT PCR (hospital order, performed in [facility] hospital lab) Nasopharyngeal Nasopharyngeal Swab     Status: None   Collection Time: 04/12/20 12:48 AM   Specimen: Nasopharyngeal Swab  Result Value Ref Range Status   SARS Coronavirus 2 NEGATIVE NEGATIVE Final    Comment: (NOTE) SARS-CoV-2 target nucleic acids are NOT DETECTED.  The SARS-CoV-2 RNA is generally detectable in upper and lower respiratory specimens during the acute phase of infection. The lowest concentration of SARS-CoV-2 viral copies this assay can detect is 250 copies / mL. A negative result does not  preclude SARS-CoV-2 infection and should not be used as the sole basis for treatment or other patient management decisions.  A negative result may occur with improper specimen collection / handling, submission of specimen other than nasopharyngeal swab, presence of viral mutation(s) within the areas targeted by this assay, and inadequate number of viral copies (<250 copies / mL). A negative result must be combined with clinical observations, patient history, and epidemiological information.  Fact Sheet for Patients:   StrictlyIdeas.no  Fact Sheet for Healthcare Providers: BankingDealers.co.za  This test is not yet approved or  cleared by the [place] FDA and has been authorized for detection and/or diagnosis of SARS-CoV-2 by FDA under an Emergency Use Authorization (EUA).  This EUA will remain in effect (meaning this test can be used) for the duration of the COVID-19 declaration under Section 564(b)(1) of the Act, 21 U.S.C. section 360bbb-3(b)(1), unless the authorization is terminated or revoked sooner.  Performed at [facility], [place] [address], [place], [place] [address]      Labs: Basic Metabolic Panel: Recent Labs  Lab 04/12/20 0120 04/13/20 0551  NA 139 135  K 3.9 3.8  CL 99 96*  CO2 24 26  GLUCOSE 122* 160*  BUN 10 15  CREATININE 0.87 0.94  CALCIUM 8.3* 9.2  MG 1.6*  --   PHOS 3.5  --    Liver Function Tests: Recent Labs  Lab 04/12/20 0120  AST 99*  ALT 77*  ALKPHOS 185*  BILITOT 0.6  PROT 7.9  ALBUMIN 3.9   No results for input(s): LIPASE, AMYLASE in the last 168 hours. No results for input(s): AMMONIA in the last 168 hours. CBC: Recent Labs  Lab 04/12/20 0120 04/13/20 0551  WBC 6.9 9.7  NEUTROABS  --  8.2*  HGB 13.3 12.2*  HCT 39.2 36.2*  MCV 93.6 94.3  PLT 243 182   Cardiac Enzymes: No results for input(s): CKTOTAL, CKMB, CKMBINDEX, TROPONINI in the last 168  hours. BNP: BNP (last 3 results) No results for input(s): BNP in the last 8760 hours.  ProBNP (last 3 results) Recent Labs    01/27/20 1229  PROBNP 377*    CBG: No results for input(s): GLUCAP in the last 168 hours.     Signed:  Nita Sells MD   [facility] 04/14/2020, 10:22 AM

## 2020-04-14 NOTE — Progress Notes (Signed)
No change from am assessment. Belongings home medications returned to patient. Copy of medication sheet attached to chart and pt received copy.

## 2020-04-14 NOTE — Progress Notes (Signed)
Patient has been discharged. Instructions reviewed. Pt denied questions and or concerns at this time. No change from am assessment. Awaiting IV RN for port de access.

## 2020-04-21 ENCOUNTER — Encounter (HOSPITAL_COMMUNITY): Payer: Self-pay

## 2020-04-21 ENCOUNTER — Emergency Department (HOSPITAL_COMMUNITY)
Admission: EM | Admit: 2020-04-21 | Discharge: 2020-04-21 | Disposition: A | Discharge status: Home / Self Care | Payer: Medicaid Other | Attending: Emergency Medicine | Admitting: Emergency Medicine

## 2020-04-21 ENCOUNTER — Other Ambulatory Visit: Payer: Self-pay

## 2020-04-21 DIAGNOSIS — M542 Cervicalgia: Secondary | ICD-10-CM | POA: Diagnosis not present

## 2020-04-21 DIAGNOSIS — M62838 Other muscle spasm: Secondary | ICD-10-CM | POA: Diagnosis not present

## 2020-04-21 DIAGNOSIS — Z7982 Long term (current) use of aspirin: Secondary | ICD-10-CM | POA: Diagnosis not present

## 2020-04-21 DIAGNOSIS — I10 Essential (primary) hypertension: Secondary | ICD-10-CM | POA: Insufficient documentation

## 2020-04-21 DIAGNOSIS — F17200 Nicotine dependence, unspecified, uncomplicated: Secondary | ICD-10-CM | POA: Diagnosis not present

## 2020-04-21 DIAGNOSIS — E119 Type 2 diabetes mellitus without complications: Secondary | ICD-10-CM | POA: Insufficient documentation

## 2020-04-21 DIAGNOSIS — J441 Chronic obstructive pulmonary disease with (acute) exacerbation: Secondary | ICD-10-CM | POA: Insufficient documentation

## 2020-04-21 DIAGNOSIS — Z79899 Other long term (current) drug therapy: Secondary | ICD-10-CM | POA: Insufficient documentation

## 2020-04-21 MED ORDER — OXYCODONE HCL 5 MG PO TABS
15.0000 mg | ORAL_TABLET | Freq: Once | ORAL | Status: AC
  Administered 2020-04-21: 15 mg via ORAL
  Filled 2020-04-21: qty 3

## 2020-04-21 MED ORDER — CYCLOBENZAPRINE HCL 10 MG PO TABS
5.0000 mg | ORAL_TABLET | Freq: Once | ORAL | Status: AC
  Administered 2020-04-21: 5 mg via ORAL
  Filled 2020-04-21: qty 1

## 2020-04-21 MED ORDER — CYCLOBENZAPRINE HCL 10 MG PO TABS: 10.0000 mg | ORAL_TABLET | Freq: Two times a day (BID) | ORAL | 0 refills | Status: AC | PRN

## 2020-04-21 NOTE — Discharge Instructions (Signed)
Please pick up prescription and take as prescribed We have provided you with a bus pass so you can get to the homeless shelter tomorrow - unfortunately they are not accepting patients tonight. Please go to [place], [place] [phone]  Return to the ED for any worsening symptoms

## 2020-04-21 NOTE — ED Triage Notes (Signed)
Patient c/o posterior neck pain x 10 days. Patient states he had a fall and was seen for that in the ED.
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
## 2020-05-07 ENCOUNTER — Emergency Department (HOSPITAL_COMMUNITY)
Admission: EM | Admit: 2020-05-07 | Discharge: 2020-05-07 | Disposition: A | Discharge status: Home / Self Care | Payer: Medicaid Other | Attending: Emergency Medicine | Admitting: Emergency Medicine

## 2020-05-07 ENCOUNTER — Encounter (HOSPITAL_COMMUNITY): Payer: Self-pay

## 2020-05-07 DIAGNOSIS — Z79899 Other long term (current) drug therapy: Secondary | ICD-10-CM | POA: Insufficient documentation

## 2020-05-07 DIAGNOSIS — G893 Neoplasm related pain (acute) (chronic): Secondary | ICD-10-CM | POA: Insufficient documentation

## 2020-05-07 DIAGNOSIS — M549 Dorsalgia, unspecified: Secondary | ICD-10-CM | POA: Diagnosis not present

## 2020-05-07 DIAGNOSIS — E119 Type 2 diabetes mellitus without complications: Secondary | ICD-10-CM | POA: Diagnosis not present

## 2020-05-07 DIAGNOSIS — F17200 Nicotine dependence, unspecified, uncomplicated: Secondary | ICD-10-CM | POA: Insufficient documentation

## 2020-05-07 DIAGNOSIS — Z85118 Personal history of other malignant neoplasm of bronchus and lung: Secondary | ICD-10-CM | POA: Insufficient documentation

## 2020-05-07 DIAGNOSIS — Z7982 Long term (current) use of aspirin: Secondary | ICD-10-CM | POA: Insufficient documentation

## 2020-05-07 DIAGNOSIS — I10 Essential (primary) hypertension: Secondary | ICD-10-CM | POA: Insufficient documentation

## 2020-05-07 DIAGNOSIS — I2510 Atherosclerotic heart disease of native coronary artery without angina pectoris: Secondary | ICD-10-CM | POA: Insufficient documentation

## 2020-05-07 DIAGNOSIS — J441 Chronic obstructive pulmonary disease with (acute) exacerbation: Secondary | ICD-10-CM | POA: Insufficient documentation

## 2020-05-07 MED ORDER — LIDOCAINE 5 % EX PTCH
1.0000 | MEDICATED_PATCH | CUTANEOUS | Status: DC
  Administered 2020-05-07: 1 via TRANSDERMAL
  Filled 2020-05-07: qty 1

## 2020-05-07 MED ORDER — CYCLOBENZAPRINE HCL 10 MG PO TABS
5.0000 mg | ORAL_TABLET | Freq: Once | ORAL | Status: AC
  Administered 2020-05-07: 5 mg via ORAL
  Filled 2020-05-07: qty 1

## 2020-05-07 MED ORDER — OXYCODONE HCL 5 MG PO TABS
15.0000 mg | ORAL_TABLET | Freq: Once | ORAL | Status: AC
  Administered 2020-05-07: 15 mg via ORAL
  Filled 2020-05-07: qty 3

## 2020-05-07 NOTE — ED Triage Notes (Signed)
Pt presents via EMS for alcohol intoxication. Pt also c/o pain all over and says he needs his oxycodone medication.
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
## 2020-05-07 NOTE — Discharge Instructions (Addendum)
Please follow-up with your primary care provider or oncologist for your chronic pain and future pain medication needs.  Return to the emergency room immediately for new or worsening symptoms or concerns, such as chest pain, shortness of breath, vomiting, abdominal pain or any concerns at all.

## 2020-05-18 ENCOUNTER — Other Ambulatory Visit: Payer: Self-pay

## 2020-05-18 ENCOUNTER — Emergency Department (HOSPITAL_COMMUNITY): Payer: Medicaid Other

## 2020-05-18 ENCOUNTER — Emergency Department (HOSPITAL_COMMUNITY)
Admission: EM | Admit: 2020-05-18 | Discharge: 2020-05-19 | Disposition: A | Discharge status: Home / Self Care | Payer: Medicaid Other | Attending: Emergency Medicine | Admitting: Emergency Medicine

## 2020-05-18 ENCOUNTER — Encounter (HOSPITAL_COMMUNITY): Payer: Self-pay

## 2020-05-18 DIAGNOSIS — I10 Essential (primary) hypertension: Secondary | ICD-10-CM | POA: Insufficient documentation

## 2020-05-18 DIAGNOSIS — R0789 Other chest pain: Secondary | ICD-10-CM | POA: Diagnosis not present

## 2020-05-18 DIAGNOSIS — Z79899 Other long term (current) drug therapy: Secondary | ICD-10-CM | POA: Diagnosis not present

## 2020-05-18 DIAGNOSIS — Z7982 Long term (current) use of aspirin: Secondary | ICD-10-CM | POA: Diagnosis not present

## 2020-05-18 DIAGNOSIS — F10129 Alcohol abuse with intoxication, unspecified: Secondary | ICD-10-CM | POA: Diagnosis not present

## 2020-05-18 DIAGNOSIS — J449 Chronic obstructive pulmonary disease, unspecified: Secondary | ICD-10-CM | POA: Insufficient documentation

## 2020-05-18 DIAGNOSIS — I2510 Atherosclerotic heart disease of native coronary artery without angina pectoris: Secondary | ICD-10-CM | POA: Insufficient documentation

## 2020-05-18 DIAGNOSIS — E119 Type 2 diabetes mellitus without complications: Secondary | ICD-10-CM | POA: Diagnosis not present

## 2020-05-18 DIAGNOSIS — Z85118 Personal history of other malignant neoplasm of bronchus and lung: Secondary | ICD-10-CM | POA: Insufficient documentation

## 2020-05-18 DIAGNOSIS — F1910 Other psychoactive substance abuse, uncomplicated: Secondary | ICD-10-CM | POA: Diagnosis not present

## 2020-05-18 DIAGNOSIS — F17200 Nicotine dependence, unspecified, uncomplicated: Secondary | ICD-10-CM | POA: Insufficient documentation

## 2020-05-18 DIAGNOSIS — F10920 Alcohol use, unspecified with intoxication, uncomplicated: Secondary | ICD-10-CM

## 2020-05-18 LAB — CBG MONITORING, ED: Glucose-Capillary: 108 mg/dL — ABNORMAL HIGH (ref 70–99)

## 2020-05-18 NOTE — ED Notes (Signed)
Pt provided sandwich and juice. Pt threw sandwhich onto floor

## 2020-05-18 NOTE — ED Notes (Signed)
Pt self removed IV and EKG leads stating " get this god damn shit off me this is stupid my heart is working I dont need none of this shit" then the IV cathter at this Prob[employer] officer. Pt attempted to get up and walk. PA Mina made aware.

## 2020-05-18 NOTE — ED Notes (Signed)
Pt urinated on floor and had BM on floor. Pt removed BP cuff and 02 detector. Floor cleaned. Called EVS to mop. Pt chair cleaned and new blanket provided.

## 2020-05-18 NOTE — ED Triage Notes (Signed)
Pt arrived via [facility] Right shoulder pain X 1 month and ETOH intoxication. Pt reports drinking "one handle of vodka today" and boil/abcess on right elbow. Pt assist to stand.   18G LAC 500 NS given in route    Hx ETOH abuse, lung and liver cancer
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
## 2020-05-18 NOTE — ED Notes (Signed)
Pt denies vitals and rips off vitals equipment

## 2020-05-18 NOTE — ED Notes (Signed)
Pt attempted to take unknown home medications of out of medication bag. All medications removed from room and placed in belongings bag labeled at nurses station. [place] aware.  Pt instructed to remain seated for safety but refused. Provided pt with urinal. Pt walked out of room dragged trash can into room and urinated in trash can then slammed door.

## 2020-05-18 NOTE — ED Notes (Signed)
Pt phone and wallet at bedside. Pt refused vitals.

## 2020-05-18 NOTE — ED Notes (Signed)
Pt refuses vitals

## 2020-05-19 ENCOUNTER — Emergency Department (HOSPITAL_COMMUNITY)
Admission: EM | Admit: 2020-05-19 | Discharge: 2020-05-19 | Discharge status: Against Medical Advice | Payer: Medicaid Other

## 2020-05-19 NOTE — ED Notes (Signed)
Pt refused to let nurse obtain vital signs for discharge. Pt was informed that he was up for discharge and then began to yell "let me just go." Security was notified that pt was beginning to become verbally aggressive to staff and jerking away when pulling off pulse ox cord.

## 2020-05-19 NOTE — Discharge Instructions (Addendum)
Follow up with Dr. Theda Sers for further evaluation of your shoulder pain.

## 2020-05-19 NOTE — ED Provider Notes (Signed)
Patient care signed out at end of shift by Rodell Perna, PA-C, needing observation until clinically sober and safe for discharge home.   He has been sleeping soundly on serial rechecks.   4:30 - Patient easily awakened. He is drinking water without nausea or difficulty swallowing. He still has right shoulder pain evaluated earlier. Patient reassured. He has an orthopedic doctor to follow up with for further evaluation.   His speech is clear and coherent. He reports he has someone that can come pick him up. Will call for his ride. He is felt appropriate for discharge home.    Charlann Lange, PA-C 05/19/20 0430    Fatima Blank, MD 05/20/20 [phone]

## 2020-05-28 ENCOUNTER — Other Ambulatory Visit: Payer: Self-pay

## 2020-05-28 ENCOUNTER — Encounter (HOSPITAL_COMMUNITY): Payer: Self-pay | Admitting: Emergency Medicine

## 2020-05-28 ENCOUNTER — Emergency Department (HOSPITAL_COMMUNITY)
Admission: EM | Admit: 2020-05-28 | Discharge: 2020-05-28 | Disposition: A | Discharge status: Home / Self Care | Payer: Medicaid Other | Attending: Emergency Medicine | Admitting: Emergency Medicine

## 2020-05-28 ENCOUNTER — Emergency Department (HOSPITAL_COMMUNITY): Payer: Medicaid Other

## 2020-05-28 DIAGNOSIS — J441 Chronic obstructive pulmonary disease with (acute) exacerbation: Secondary | ICD-10-CM | POA: Insufficient documentation

## 2020-05-28 DIAGNOSIS — Z85118 Personal history of other malignant neoplasm of bronchus and lung: Secondary | ICD-10-CM | POA: Insufficient documentation

## 2020-05-28 DIAGNOSIS — I2510 Atherosclerotic heart disease of native coronary artery without angina pectoris: Secondary | ICD-10-CM | POA: Diagnosis not present

## 2020-05-28 DIAGNOSIS — Z7982 Long term (current) use of aspirin: Secondary | ICD-10-CM | POA: Diagnosis not present

## 2020-05-28 DIAGNOSIS — R0602 Shortness of breath: Secondary | ICD-10-CM

## 2020-05-28 DIAGNOSIS — R070 Pain in throat: Secondary | ICD-10-CM

## 2020-05-28 DIAGNOSIS — Z5900 Homelessness unspecified: Secondary | ICD-10-CM

## 2020-05-28 DIAGNOSIS — Z7951 Long term (current) use of inhaled steroids: Secondary | ICD-10-CM | POA: Diagnosis not present

## 2020-05-28 DIAGNOSIS — F17200 Nicotine dependence, unspecified, uncomplicated: Secondary | ICD-10-CM | POA: Insufficient documentation

## 2020-05-28 DIAGNOSIS — Z79899 Other long term (current) drug therapy: Secondary | ICD-10-CM | POA: Diagnosis not present

## 2020-05-28 LAB — BASIC METABOLIC PANEL
Anion gap: 15 (ref 5–15)
BUN: 7 mg/dL (ref 6–20)
CO2: 21 mmol/L — ABNORMAL LOW (ref 22–32)
Calcium: 8.7 mg/dL — ABNORMAL LOW (ref 8.9–10.3)
Chloride: 99 mmol/L (ref 98–111)
Creatinine, Ser: 0.79 mg/dL (ref 0.61–1.24)
GFR calc Af Amer: >60 mL/min (ref >60)
GFR calc non Af Amer: >60 mL/min (ref >60)
Glucose, Bld: 114 mg/dL — ABNORMAL HIGH (ref 70–99)
Potassium: 3.3 mmol/L — ABNORMAL LOW (ref 3.5–5.1)
Sodium: 135 mmol/L (ref 135–145)

## 2020-05-28 LAB — CBC
HCT: 40.2 % (ref 39.0–52.0)
Hemoglobin: 13.2 g/dL (ref 13.0–17.0)
MCH: 32 pg (ref 26.0–34.0)
MCHC: 32.8 g/dL (ref 30.0–36.0)
MCV: 97.6 fL (ref 80.0–100.0)
Platelets: 232 10*3/uL (ref 150–400)
RBC: 4.12 MIL/uL — ABNORMAL LOW (ref 4.22–5.81)
RDW: 15.7 % — ABNORMAL HIGH (ref 11.5–15.5)
WBC: 5.2 10*3/uL (ref 4.0–10.5)
nRBC: 0 % (ref 0.0–0.2)

## 2020-05-28 LAB — ETHANOL: Alcohol, Ethyl (B): 74 mg/dL — ABNORMAL HIGH (ref <10)

## 2020-05-28 MED ORDER — ONDANSETRON HCL 4 MG/2ML IJ SOLN
4.0000 mg | Freq: Once | INTRAMUSCULAR | Status: AC
  Administered 2020-05-28: 4 mg via INTRAVENOUS
  Filled 2020-05-28: qty 2

## 2020-05-28 MED ORDER — OXYCODONE HCL 5 MG PO TABS
15.0000 mg | ORAL_TABLET | Freq: Once | ORAL | Status: DC
  Filled 2020-05-28: qty 3

## 2020-05-28 MED ORDER — ONDANSETRON HCL 4 MG PO TABS
4.0000 mg | ORAL_TABLET | Freq: Once | ORAL | Status: DC
  Administered 2020-05-28: 4 mg via ORAL

## 2020-05-28 MED ORDER — LORAZEPAM 2 MG/ML IJ SOLN
1.0000 mg | Freq: Once | INTRAMUSCULAR | Status: AC
  Administered 2020-05-28: 1 mg via INTRAVENOUS
  Filled 2020-05-28: qty 1

## 2020-05-28 MED ORDER — POTASSIUM CHLORIDE CRYS ER 20 MEQ PO TBCR
20.0000 meq | EXTENDED_RELEASE_TABLET | Freq: Once | ORAL | Status: DC
  Filled 2020-05-28: qty 1

## 2020-05-28 MED ORDER — AMOXICILLIN-POT CLAVULANATE 875-125 MG PO TABS: 1.0000 | ORAL_TABLET | Freq: Two times a day (BID) | ORAL | 0 refills | Status: AC

## 2020-05-28 MED ORDER — POTASSIUM CHLORIDE ER 10 MEQ PO TBCR: 10.0000 meq | EXTENDED_RELEASE_TABLET | Freq: Every day | ORAL | 0 refills | Status: AC

## 2020-05-28 NOTE — Progress Notes (Signed)
   05/28/20 1726  TOC ED Mini Assessment  TOC Time spent with patient (minutes): 45  PING Used in TOC Assessment No  Admission or Readmission Diverted Yes  Interventions which prevented an admission or readmission IRC;Homeless Screening  What brought you to the Emergency Department?  Pain all over  Barriers to Discharge No Barriers Identified  Means of departure Public Transportation  Key Contact 1 Kim Brown-ex girlfriend-no answe no option for voicemail [phone]  Key Contact 2 Rodney-Brother-no answer left HIPAA compliant voicemail [phone]  Call outcome Unable to connect Pt with ride/shelter  CSW met with Pt at bedside. Pt recently broke up with girlfriend and now has nowhere to stay.Pt had been sharing hotel rooms and staying in tent or car with girlfriend, but is now unsheltered. CSW attempted to contact family or friends who could assist Pt with shelter and/or transportation. Unable to reach anyone via phone. CSW provided Pt with sack lunch, [employer] of fre[employer] meals, homelessness resources and a bus pass for transportation upon discharge. Pt expressed thanks.
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
## 2020-05-28 NOTE — ED Notes (Signed)
Went to pull due medications, medications already pulled, pt states he received the potassium, some stomach medication and pain medication from previous nurse.

## 2020-05-28 NOTE — Discharge Instructions (Addendum)
Your lab work and work-up today was overall reassuring. Please make sure to follow-up with your primary care doctor about your ongoing shortness of breath. Please make sure to follow-up with your oncologist about your throat pain. Return to the ER if your symptoms worsen. I have provided the resource guide for local homeless shelters in case needed.

## 2020-05-28 NOTE — ED Triage Notes (Signed)
Pt reports he has lung cancer, when asked what brought him to the ED tonight, pt stated "everything". Reports he is currently receiving radiation treatment and his throat hurts.

## 2020-11-10 DEATH — deceased

## 2021-02-22 ENCOUNTER — Ambulatory Visit (HOSPITAL_COMMUNITY)
Admission: RE | Admit: 2021-02-22 | Payer: Self-pay | Source: Ambulatory Visit | Attending: Cardiology | Admitting: Cardiology
